# Patient Record
Sex: Female | Born: 1960 | Race: White | Hispanic: No | Marital: Single | Smoking: Never smoker
Health system: Southern US, Community
[De-identification: ages and names within clinical notes are randomized; demographics above are authoritative.]

## PROBLEM LIST (undated history)

## (undated) DIAGNOSIS — IMO0002 Reserved for concepts with insufficient information to code with codable children: Secondary | ICD-10-CM

## (undated) DIAGNOSIS — R159 Full incontinence of feces: Secondary | ICD-10-CM

## (undated) DIAGNOSIS — G8220 Paraplegia, unspecified: Secondary | ICD-10-CM

## (undated) DIAGNOSIS — F419 Anxiety disorder, unspecified: Secondary | ICD-10-CM

## (undated) DIAGNOSIS — A048 Other specified bacterial intestinal infections: Secondary | ICD-10-CM

## (undated) HISTORY — DX: Other specified bacterial intestinal infections: A04.8

## (undated) HISTORY — PX: BACK SURGERY: SHX140

## (undated) HISTORY — PX: UPPER GASTROINTESTINAL ENDOSCOPY: SHX188

## (undated) HISTORY — DX: Full incontinence of feces: R15.9

## (undated) HISTORY — DX: Anxiety disorder, unspecified: F41.9

## (undated) HISTORY — DX: Paraplegia, unspecified: G82.20

## (undated) HISTORY — DX: Reserved for concepts with insufficient information to code with codable children: IMO0002

---

## 2006-08-30 ENCOUNTER — Ambulatory Visit: Payer: Self-pay | Admitting: Internal Medicine

## 2006-09-08 ENCOUNTER — Ambulatory Visit: Payer: Self-pay | Admitting: Internal Medicine

## 2007-07-02 DIAGNOSIS — G8220 Paraplegia, unspecified: Secondary | ICD-10-CM | POA: Insufficient documentation

## 2007-07-02 DIAGNOSIS — F39 Unspecified mood [affective] disorder: Secondary | ICD-10-CM

## 2007-07-02 DIAGNOSIS — K5289 Other specified noninfective gastroenteritis and colitis: Secondary | ICD-10-CM

## 2007-07-02 DIAGNOSIS — Z87828 Personal history of other (healed) physical injury and trauma: Secondary | ICD-10-CM

## 2007-07-02 DIAGNOSIS — K625 Hemorrhage of anus and rectum: Secondary | ICD-10-CM

## 2007-07-02 DIAGNOSIS — R159 Full incontinence of feces: Secondary | ICD-10-CM | POA: Insufficient documentation

## 2007-07-14 ENCOUNTER — Encounter
Admission: RE | Admit: 2007-07-14 | Discharge: 2007-07-14 | Payer: Self-pay | Admitting: Physical Medicine & Rehabilitation

## 2009-11-02 ENCOUNTER — Emergency Department (HOSPITAL_COMMUNITY): Admission: EM | Admit: 2009-11-02 | Discharge: 2009-11-02 | Payer: Self-pay | Admitting: Emergency Medicine

## 2010-05-23 LAB — COMPREHENSIVE METABOLIC PANEL
AST: 24 U/L (ref 0–37)
Alkaline Phosphatase: 55 U/L (ref 39–117)
Calcium: 10 mg/dL (ref 8.4–10.5)
Creatinine, Ser: 0.85 mg/dL (ref 0.4–1.2)
GFR calc non Af Amer: >60 mL/min (ref >60)
Glucose, Bld: 117 mg/dL — ABNORMAL HIGH (ref 70–99)
Sodium: 139 mEq/L (ref 135–145)

## 2010-05-23 LAB — CBC
MCV: 93.6 fL (ref 78.0–100.0)
Platelets: 300 10*3/uL (ref 150–400)
RDW: 12.1 % (ref 11.5–15.5)

## 2010-05-23 LAB — DIFFERENTIAL
Basophils Relative: 0 % (ref 0–1)
Lymphs Abs: 2.3 10*3/uL (ref 0.7–4.0)
Monocytes Absolute: 0.6 10*3/uL (ref 0.1–1.0)
Neutrophils Relative %: 79 % — ABNORMAL HIGH (ref 43–77)

## 2010-05-23 LAB — LIPASE, BLOOD: Lipase: 20 U/L (ref 11–59)

## 2010-05-23 LAB — URINALYSIS, ROUTINE W REFLEX MICROSCOPIC
Bilirubin Urine: NEGATIVE
Ketones, ur: NEGATIVE mg/dL
Protein, ur: NEGATIVE mg/dL
Specific Gravity, Urine: 1.024 (ref 1.005–1.030)

## 2010-05-23 LAB — URINE CULTURE

## 2010-07-11 ENCOUNTER — Ambulatory Visit: Payer: Self-pay | Admitting: Physical Medicine and Rehabilitation

## 2010-07-15 ENCOUNTER — Encounter: Payer: 59 | Attending: Physical Medicine and Rehabilitation | Admitting: Physical Medicine and Rehabilitation

## 2010-07-15 DIAGNOSIS — G8220 Paraplegia, unspecified: Secondary | ICD-10-CM

## 2010-07-15 DIAGNOSIS — N319 Neuromuscular dysfunction of bladder, unspecified: Secondary | ICD-10-CM

## 2010-07-15 DIAGNOSIS — M719 Bursopathy, unspecified: Secondary | ICD-10-CM

## 2010-07-15 DIAGNOSIS — K592 Neurogenic bowel, not elsewhere classified: Secondary | ICD-10-CM | POA: Insufficient documentation

## 2010-07-15 DIAGNOSIS — M67919 Unspecified disorder of synovium and tendon, unspecified shoulder: Secondary | ICD-10-CM

## 2010-07-15 DIAGNOSIS — S069X9S Unspecified intracranial injury with loss of consciousness of unspecified duration, sequela: Secondary | ICD-10-CM | POA: Insufficient documentation

## 2010-07-15 DIAGNOSIS — S069XAS Unspecified intracranial injury with loss of consciousness status unknown, sequela: Secondary | ICD-10-CM | POA: Insufficient documentation
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
## 2010-08-20 ENCOUNTER — Encounter: Payer: 59 | Admitting: Physical Medicine and Rehabilitation

## 2011-10-22 DIAGNOSIS — M810 Age-related osteoporosis without current pathological fracture: Secondary | ICD-10-CM | POA: Insufficient documentation

## 2011-10-29 DIAGNOSIS — E559 Vitamin D deficiency, unspecified: Secondary | ICD-10-CM | POA: Insufficient documentation

## 2012-10-03 ENCOUNTER — Encounter: Payer: Self-pay | Admitting: *Deleted

## 2012-10-03 ENCOUNTER — Telehealth: Payer: Self-pay | Admitting: Internal Medicine

## 2012-10-03 NOTE — Telephone Encounter (Signed)
Left message for patient to call back  

## 2012-10-03 NOTE — Telephone Encounter (Signed)
Patient will come in and see Dr. Juanda Chance tomorrow at 9:45

## 2012-10-04 ENCOUNTER — Ambulatory Visit (INDEPENDENT_AMBULATORY_CARE_PROVIDER_SITE_OTHER): Payer: 59 | Admitting: Internal Medicine

## 2012-10-04 ENCOUNTER — Encounter: Payer: Self-pay | Admitting: Internal Medicine

## 2012-10-04 ENCOUNTER — Other Ambulatory Visit (INDEPENDENT_AMBULATORY_CARE_PROVIDER_SITE_OTHER): Payer: 59

## 2012-10-04 VITALS — BP 110/78 | HR 68

## 2012-10-04 DIAGNOSIS — K219 Gastro-esophageal reflux disease without esophagitis: Secondary | ICD-10-CM

## 2012-10-04 DIAGNOSIS — K920 Hematemesis: Secondary | ICD-10-CM

## 2012-10-04 LAB — CBC WITH DIFFERENTIAL/PLATELET
Eosinophils Absolute: 0.1 10*3/uL (ref 0.0–0.7)
Hemoglobin: 14.4 g/dL (ref 12.0–15.0)
Lymphs Abs: 2.3 10*3/uL (ref 0.7–4.0)
Monocytes Absolute: 0.4 10*3/uL (ref 0.1–1.0)
Monocytes Relative: 5.2 % (ref 3.0–12.0)
Neutro Abs: 4.3 10*3/uL (ref 1.4–7.7)
Platelets: 280 10*3/uL (ref 150.0–400.0)
RDW: 12.5 % (ref 11.5–14.6)

## 2012-10-04 LAB — COMPREHENSIVE METABOLIC PANEL
ALT: 15 U/L (ref 0–35)
AST: 16 U/L (ref 0–37)
Albumin: 4.7 g/dL (ref 3.5–5.2)
Alkaline Phosphatase: 76 U/L (ref 39–117)
CO2: 29 mEq/L (ref 19–32)
Calcium: 10.3 mg/dL (ref 8.4–10.5)
Chloride: 101 mEq/L (ref 96–112)
GFR: 109.55 mL/min (ref >60.00)
Glucose, Bld: 96 mg/dL (ref 70–99)
Potassium: 3.7 mEq/L (ref 3.5–5.1)
Sodium: 138 mEq/L (ref 135–145)
Total Bilirubin: 1 mg/dL (ref 0.3–1.2)
Total Protein: 8.2 g/dL (ref 6.0–8.3)

## 2012-10-04 LAB — IBC PANEL: Iron: 111 ug/dL (ref 42–145)

## 2012-10-04 MED ORDER — PROMETHAZINE HCL 12.5 MG PO TABS: ORAL_TABLET | ORAL | Status: DC

## 2012-10-04 MED ORDER — OMEPRAZOLE 20 MG PO CPDR: 20.0000 mg | DELAYED_RELEASE_CAPSULE | Freq: Every day | ORAL | Status: DC

## 2012-10-04 NOTE — Patient Instructions (Addendum)
You have been scheduled for an endoscopy with propofol. Please follow written instructions given to you at your visit today. If you use inhalers (even only as needed), please bring them with you on the day of your procedure. Your physician has requested that you go to www.startemmi.com and enter the access code given to you at your visit today. This web site gives a general overview about your procedure. However, you should still follow specific instructions given to you by our office regarding your preparation for the procedure.  Your physician has requested that you go to the basement for the following lab work before leaving today: CBC, IBC, B12, CMET, TSH  We have sent the following medications to your mail in pharmacy: Phenergan Prilosec  CC: Dr Felipa Eth
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
## 2012-10-05 NOTE — Interval H&P Note (Signed)
History and Physical Interval Note:  10/05/2012 9:15 PM  Catherine Oneal  has presented today for surgery, with the diagnosis of hematemesis 578.0  The various methods of treatment have been discussed with the patient and family. After consideration of risks, benefits and other options for treatment, the patient has consented to  Procedure(s) with comments: ESOPHAGOGASTRODUODENOSCOPY (EGD) (N/A) - patient is paraplegic as a surgical intervention .  The patient's history has been reviewed, patient examined, no change in status, stable for surgery.  I have reviewed the patient's chart and labs.  Questions were answered to the patient's satisfaction.     [name]

## 2012-10-06 ENCOUNTER — Encounter (HOSPITAL_COMMUNITY)
Admission: RE | Disposition: A | Discharge status: Home / Self Care | Payer: Self-pay | Source: Ambulatory Visit | Attending: Internal Medicine

## 2012-10-06 ENCOUNTER — Telehealth: Payer: Self-pay | Admitting: Internal Medicine

## 2012-10-06 ENCOUNTER — Ambulatory Visit (HOSPITAL_COMMUNITY)
Admission: RE | Admit: 2012-10-06 | Discharge: 2012-10-06 | Disposition: A | Discharge status: Home / Self Care | Payer: 59 | Source: Ambulatory Visit | Attending: Internal Medicine | Admitting: Internal Medicine

## 2012-10-06 ENCOUNTER — Encounter (HOSPITAL_COMMUNITY): Payer: Self-pay | Admitting: *Deleted

## 2012-10-06 DIAGNOSIS — F3289 Other specified depressive episodes: Secondary | ICD-10-CM | POA: Insufficient documentation

## 2012-10-06 DIAGNOSIS — K3189 Other diseases of stomach and duodenum: Secondary | ICD-10-CM | POA: Insufficient documentation

## 2012-10-06 DIAGNOSIS — D131 Benign neoplasm of stomach: Secondary | ICD-10-CM | POA: Insufficient documentation

## 2012-10-06 DIAGNOSIS — A048 Other specified bacterial intestinal infections: Secondary | ICD-10-CM | POA: Insufficient documentation

## 2012-10-06 DIAGNOSIS — R159 Full incontinence of feces: Secondary | ICD-10-CM | POA: Insufficient documentation

## 2012-10-06 DIAGNOSIS — K589 Irritable bowel syndrome without diarrhea: Secondary | ICD-10-CM | POA: Insufficient documentation

## 2012-10-06 DIAGNOSIS — F329 Major depressive disorder, single episode, unspecified: Secondary | ICD-10-CM | POA: Insufficient documentation

## 2012-10-06 DIAGNOSIS — G8220 Paraplegia, unspecified: Secondary | ICD-10-CM | POA: Insufficient documentation

## 2012-10-06 DIAGNOSIS — K2960 Other gastritis without bleeding: Secondary | ICD-10-CM

## 2012-10-06 DIAGNOSIS — K920 Hematemesis: Secondary | ICD-10-CM

## 2012-10-06 HISTORY — PX: ESOPHAGOGASTRODUODENOSCOPY: SHX5428

## 2012-10-06 SURGERY — EGD (ESOPHAGOGASTRODUODENOSCOPY)
Anesthesia: Moderate Sedation

## 2012-10-06 MED ORDER — FENTANYL CITRATE 0.05 MG/ML IJ SOLN
INTRAMUSCULAR | Status: DC | PRN
  Administered 2012-10-06 (×3): 25 ug via INTRAVENOUS

## 2012-10-06 MED ORDER — MIDAZOLAM HCL 10 MG/2ML IJ SOLN
INTRAMUSCULAR | Status: DC | PRN
  Administered 2012-10-06 (×5): 2 mg via INTRAVENOUS

## 2012-10-06 MED ORDER — BUTAMBEN-TETRACAINE-BENZOCAINE 2-2-14 % EX AERO
INHALATION_SPRAY | CUTANEOUS | Status: DC | PRN
  Administered 2012-10-06: 1 via TOPICAL

## 2012-10-06 MED ORDER — SODIUM CHLORIDE 0.9 % IV SOLN
INTRAVENOUS | Status: DC
  Administered 2012-10-06: 10:00:00 via INTRAVENOUS

## 2012-10-06 MED ORDER — MIDAZOLAM HCL 10 MG/2ML IJ SOLN
INTRAMUSCULAR | Status: AC
  Filled 2012-10-06: qty 4

## 2012-10-06 MED ORDER — FENTANYL CITRATE 0.05 MG/ML IJ SOLN
INTRAMUSCULAR | Status: AC
  Filled 2012-10-06: qty 4

## 2012-10-06 NOTE — Op Note (Signed)
[facility] [address] [place], [address]   ENDOSCOPY PROCEDURE REPORT  PATIENT: Catherine, Oneal.  MR#: [id number] BIRTHDATE: Jul 13, 1960 , 51  yrs. old GENDER: Female ENDOSCOPIST: Hart Carwin, MD REFERRED BY:  Chilton Greathouse, M.D. PROCEDURE DATE:  10/06/2012 PROCEDURE:  EGD w/ biopsy ASA CLASS:     Class II INDICATIONS:  Dyspepsia. MEDICATIONS: These medications were titrated to patient response per physician's verbal order, Fentanyl 100 mcg IV, and Versed 7 mg IV  TOPICAL ANESTHETIC: Cetacaine Spray  DESCRIPTION OF PROCEDURE: After the risks benefits and alternatives of the procedure were thoroughly explained, informed consent was obtained.  The Pentax Gastroscope [id number] endoscope was introduced through the mouth and advanced to the second portion of the duodenum. Without limitations.  The instrument was slowly withdrawn as the mucosa was fully examined.        ESOPHAGUS: The mucosa of the esophagus appeared normal.  STOMACH: Mild gastritis (inflammation) was found in the gastric antrum.Several streaks of erythema, no blood in the stomach, several fundic gland polyps in proximal stomach,  A biopsy was performed using cold forceps.  Sample obtained for helicobacter pylori testing.Duodenal bulb and descending duodenum were normal Retroflexed views revealed no abnormalities.  There was moderate amount of bile along the greater curvature of the stomach.   The scope was then withdrawn from the patient and the procedure completed.  COMPLICATIONS: There were no complications. ENDOSCOPIC IMPRESSION:  1.   The mucosa of the esophagus appeared normal 2.   Gastritis (inflammation) was found in the gastric antrum; biopsy 3 . normal duodenal bulb and descending duodenum Nothing to account for patient's symptoms,  RECOMMENDATIONS: 1.  Await biopsy results 2.  Continue PPI 3.  may need to reevaluate gall bladder function/HIDA, her LFT's  are normal  REPEAT EXAM: no  eSigned:  Hart Carwin, MD 10/06/2012 10:42 AM   CC:  PATIENT NAME:  Catherine Oneal. MR#: [id number]

## 2012-10-06 NOTE — Interval H&P Note (Signed)
History and Physical Interval Note:  10/06/2012 10:14 AM  Catherine Oneal  has presented today for surgery, with the diagnosis of hematemesis 578.0  The various methods of treatment have been discussed with the patient and family. After consideration of risks, benefits and other options for treatment, the patient has consented to  Procedure(s) with comments: ESOPHAGOGASTRODUODENOSCOPY (EGD) (N/A) - patient is paraplegic as a surgical intervention .  The patient's history has been reviewed, patient examined, no change in status, stable for surgery.  I have reviewed the patient's chart and labs.  Questions were answered to the patient's satisfaction.     [name]

## 2012-10-06 NOTE — Telephone Encounter (Signed)
Let pt know we need to wait for the bx results to come back and then Dr. Juanda Chance will go from there. Pt aware.

## 2012-10-07 ENCOUNTER — Encounter (HOSPITAL_COMMUNITY): Payer: Self-pay | Admitting: Internal Medicine

## 2012-10-07 ENCOUNTER — Other Ambulatory Visit: Payer: Self-pay | Admitting: Internal Medicine

## 2012-10-07 ENCOUNTER — Telehealth: Payer: Self-pay | Admitting: Internal Medicine

## 2012-10-07 DIAGNOSIS — K8020 Calculus of gallbladder without cholecystitis without obstruction: Secondary | ICD-10-CM

## 2012-10-07 MED ORDER — PROMETHAZINE HCL 12.5 MG PO TABS: ORAL_TABLET | ORAL | Status: DC

## 2012-10-07 NOTE — Progress Notes (Signed)
Patient has been scheduled for HIDA with [facility] on 10/17/12 @ 1 pm at [facility]. I have left a message for patient to call back. I have spoken to patient and have advised her of this. She verbalizes understanding and thinks she may need to change the date due to travel plans. I have given her the phone number to radiology to reschedule if needed. She also states that she feels the need to have an "urgent physical" and does not think she can wait for an extended time for a physical. I have advised her that unfortunately, most any pcp will have somewhat of a wait for her to get in to be seen. She states she will go to urgent care.

## 2012-10-07 NOTE — Telephone Encounter (Signed)
rx sent

## 2012-10-08 ENCOUNTER — Encounter: Payer: Self-pay | Admitting: Internal Medicine

## 2012-10-10 ENCOUNTER — Telehealth: Payer: Self-pay | Admitting: Internal Medicine

## 2012-10-10 MED ORDER — AMOXICILLIN 500 MG PO TABS: 500.0000 mg | ORAL_TABLET | Freq: Three times a day (TID) | ORAL | Status: DC

## 2012-10-10 MED ORDER — OMEPRAZOLE 20 MG PO CPDR: 20.0000 mg | DELAYED_RELEASE_CAPSULE | Freq: Two times a day (BID) | ORAL | Status: DC

## 2012-10-10 MED ORDER — CLARITHROMYCIN 500 MG PO TABS: 500.0000 mg | ORAL_TABLET | Freq: Two times a day (BID) | ORAL | Status: DC

## 2012-10-10 NOTE — Telephone Encounter (Signed)
Informed pt of H. Pylori infection and I ordered her meds at her pharmacy and mailed her the path report; pt stated understanding.

## 2012-10-11 ENCOUNTER — Encounter: Payer: Self-pay | Admitting: Internal Medicine

## 2012-10-11 NOTE — Progress Notes (Signed)
I have spoken to [facility] @ Radiology scheduling. She has cancelled patient's HIDA scan as per Dr Regino Schultze request. Dr Juanda Chance states that patient was recently found to have HPylori. Therefore, she needs to be treated for this before proceeding with HIDA scan as the H Pylori may be the cause of her discomfort. Patient has been advised of this and verbalizes understanding. She will call us at the end of her H Pylori treatment course to let us know how she is doing.

## 2012-10-12 ENCOUNTER — Telehealth: Payer: Self-pay | Admitting: Internal Medicine

## 2012-10-12 NOTE — Telephone Encounter (Signed)
If she take Prilosec for few weeks or months, it will not have a significant impact on her bones. By the way , I don't see where she ever had a bone density. She should have it done every 5 years since she is a risk for osteoporosis. Did Dr Duaine Dredge do one? I don't know what kind of spine specialist she needs, there is a Spine center on [address], she may call to enquire about their services.

## 2012-10-12 NOTE — Telephone Encounter (Signed)
Patient is concerned about taking Prilosec and osteoporosis side effect. Especially because she is a paraplegic. She is asking if Dr. Juanda Chance knows of any spinal injury specialist that she can recommend. Patient wants to get connected to one in this area. Please, advise.

## 2012-10-13 NOTE — Telephone Encounter (Signed)
Left a message for patient to call me. 

## 2012-10-13 NOTE — Telephone Encounter (Signed)
Patient given Dr. Regino Schultze recommendations. She states Dr. Felipa Eth has done several bone density studies on her.

## 2012-10-14 ENCOUNTER — Telehealth: Payer: Self-pay | Admitting: Internal Medicine

## 2012-10-14 NOTE — Telephone Encounter (Signed)
Spoke with patient and clarified that Biaxin is a trade name of the medication she is taking.

## 2012-10-17 ENCOUNTER — Encounter (HOSPITAL_COMMUNITY): Admission: RE | Admit: 2012-10-17 | Payer: 59 | Source: Ambulatory Visit

## 2012-11-08 ENCOUNTER — Encounter: Payer: Self-pay | Admitting: *Deleted

## 2012-11-14 DIAGNOSIS — N319 Neuromuscular dysfunction of bladder, unspecified: Secondary | ICD-10-CM | POA: Insufficient documentation

## 2012-11-22 ENCOUNTER — Encounter: Payer: Self-pay | Admitting: Internal Medicine

## 2012-11-22 ENCOUNTER — Ambulatory Visit (INDEPENDENT_AMBULATORY_CARE_PROVIDER_SITE_OTHER): Payer: 59 | Admitting: Internal Medicine

## 2012-11-22 VITALS — BP 92/60 | HR 76 | Ht 69.0 in | Wt 130.0 lb

## 2012-11-22 DIAGNOSIS — A048 Other specified bacterial intestinal infections: Secondary | ICD-10-CM

## 2012-11-22 DIAGNOSIS — G8220 Paraplegia, unspecified: Secondary | ICD-10-CM

## 2012-11-22 DIAGNOSIS — B9681 Helicobacter pylori [H. pylori] as the cause of diseases classified elsewhere: Secondary | ICD-10-CM

## 2012-11-22 DIAGNOSIS — K625 Hemorrhage of anus and rectum: Secondary | ICD-10-CM

## 2012-11-22 MED ORDER — MOVIPREP 100 G PO SOLR: 1.0000 | Freq: Once | ORAL | Status: DC

## 2012-11-22 NOTE — Addendum Note (Signed)
Addended by: Richardson Chiquito on: 11/22/2012 04:07 PM   Modules accepted: Orders

## 2012-11-22 NOTE — Addendum Note (Signed)
Addended by: Richardson Chiquito on: 11/22/2012 10:37 AM   Modules accepted: Orders
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
## 2012-11-22 NOTE — Patient Instructions (Addendum)
You have been scheduled for a colonoscopy with propofol. Please follow written instructions given to you at your visit today.  Please pick up your prep kit at the pharmacy within the next 1-3 days. If you use inhalers (even only as needed), please bring them with you on the day of your procedure. Your physician has requested that you go to www.startemmi.com and enter the access code given to you at your visit today. This web site gives a general overview about your procedure. However, you should still follow specific instructions given to you by our office regarding your preparation for the procedure.  You have been scheduled for an H. Pylori/Urea Breath Test. Your appointment is on 12/09/12 at 7:15 am. Please go to [facility] Admitting for check in. Please plan on being there for around 20-30 minutes.  Please follow the instructions below to prepare for your test:  1. 12/08/12 (one day prior to your test) avoid high gas foods, high fiber goods, fruits and fruit juices, vegetables, beans, cereals, fiber supplements, onion and bell peppers. You may eat hamburger, chicken, beef, tuna fish and eggs.  2. You should have nothing to eat or drink after 9:00 pm on 12/08/12.   3. Do not smoke, chew gum or eat hard candy the morning of your test.  4. You may not have the test within 2 weeks after taking any antibiotics or within 4 weeks if confirming eradication of H.Pylori.  5. On 11/25/12 (2 weeks prior to your test), stop any Pepto-Bismol and/or proton pump inhibitors (Prevacid, Zegerid, Nexium, Prilosec, Protonix, Aciphex, Dexilant).  6. You may use over the counter strength Axid, Pepcid, Tagamet and Zantac 2 weeks prior to your tests. However, you should STOP these 24 hours before the test. You may use Tums and Maalox. ______________________________________________________________________________________________________________  We have sent the following medications to your pharmacy for you to pick up  at your convenience: Phenergan  Please discontinue omeprazole.  Your physician has requested that you go to the basement for the following lab work before leaving today: B12  CC: Dr Felipa Eth

## 2012-12-09 ENCOUNTER — Encounter (HOSPITAL_COMMUNITY)
Admission: RE | Disposition: A | Discharge status: Home / Self Care | Payer: Self-pay | Source: Ambulatory Visit | Attending: Internal Medicine

## 2012-12-09 ENCOUNTER — Ambulatory Visit (HOSPITAL_COMMUNITY)
Admission: RE | Admit: 2012-12-09 | Discharge: 2012-12-09 | Disposition: A | Discharge status: Home / Self Care | Payer: 59 | Source: Ambulatory Visit | Attending: Internal Medicine | Admitting: Internal Medicine

## 2012-12-09 DIAGNOSIS — K2960 Other gastritis without bleeding: Secondary | ICD-10-CM | POA: Insufficient documentation

## 2012-12-09 DIAGNOSIS — A048 Other specified bacterial intestinal infections: Secondary | ICD-10-CM | POA: Insufficient documentation

## 2012-12-09 HISTORY — PX: BREATH TEK H PYLORI: SHX5422

## 2012-12-09 SURGERY — BREATH TEST, FOR HELICOBACTER PYLORI
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
## 2012-12-09 NOTE — Interval H&P Note (Signed)
History and Physical Interval Note:  12/09/2012 8:00 AM  Catherine Oneal  has presented today for surgery, with the diagnosis of h pylori  The various methods of treatment have been discussed with the patient and family. After consideration of risks, benefits and other options for treatment, the patient has consented to  Procedure(s): BREATH TEK H PYLORI (N/A) as a surgical intervention .  The patient's history has been reviewed, patient examined, no change in status, stable for surgery.  I have reviewed the patient's chart and labs.  Questions were answered to the patient's satisfaction.     [name]

## 2012-12-12 ENCOUNTER — Encounter (HOSPITAL_COMMUNITY): Payer: Self-pay | Admitting: Internal Medicine

## 2012-12-20 ENCOUNTER — Other Ambulatory Visit: Payer: Self-pay | Admitting: *Deleted

## 2012-12-20 ENCOUNTER — Telehealth: Payer: Self-pay | Admitting: Internal Medicine

## 2012-12-20 DIAGNOSIS — K625 Hemorrhage of anus and rectum: Secondary | ICD-10-CM

## 2012-12-20 NOTE — Telephone Encounter (Signed)
Left a message for patient again. 

## 2012-12-20 NOTE — Telephone Encounter (Signed)
Left a message for patient to call me. 

## 2012-12-20 NOTE — Telephone Encounter (Signed)
Spoke with patient and she wants to rescheduled to December for colonoscopy. Spoke with Noreene Larsson and moved to 02/08/13 at 10:30 AM. Patient aware/

## 2012-12-23 ENCOUNTER — Encounter: Payer: Self-pay | Admitting: Internal Medicine

## 2013-02-06 ENCOUNTER — Telehealth: Payer: Self-pay | Admitting: Internal Medicine

## 2013-02-07 NOTE — Telephone Encounter (Signed)
Left message for patient to call me again. 

## 2013-02-07 NOTE — Telephone Encounter (Signed)
Left a message for patient to call me. 

## 2013-02-07 NOTE — Telephone Encounter (Signed)
Please charge late cancellation. 

## 2013-02-07 NOTE — Telephone Encounter (Signed)
Spoke with patient and she is having problems with her insurance not paying for procedure. She is working with her employer and is trying to work it out. Needs to cancel procedure at [facility] tomorrow. She will call back to reschedule once she has these issues resolved. Cancelled procedure with Noreene Larsson.

## 2013-02-08 ENCOUNTER — Encounter (HOSPITAL_COMMUNITY): Admission: RE | Payer: Self-pay | Source: Ambulatory Visit

## 2013-02-08 ENCOUNTER — Ambulatory Visit (HOSPITAL_COMMUNITY): Admission: RE | Admit: 2013-02-08 | Payer: 59 | Source: Ambulatory Visit | Admitting: Internal Medicine

## 2013-02-08 SURGERY — COLONOSCOPY
Anesthesia: Moderate Sedation

## 2013-05-09 DIAGNOSIS — R32 Unspecified urinary incontinence: Secondary | ICD-10-CM | POA: Insufficient documentation

## 2013-05-21 DIAGNOSIS — F419 Anxiety disorder, unspecified: Secondary | ICD-10-CM | POA: Insufficient documentation

## 2013-05-26 ENCOUNTER — Telehealth: Payer: Self-pay | Admitting: Internal Medicine

## 2013-05-26 DIAGNOSIS — R109 Unspecified abdominal pain: Secondary | ICD-10-CM

## 2013-05-26 NOTE — Telephone Encounter (Signed)
OK I will see her. We may get a stool test for H.Pylori before I see her.

## 2013-05-26 NOTE — Telephone Encounter (Signed)
Left a message for patient to call me. 

## 2013-05-26 NOTE — Telephone Encounter (Signed)
Spoke with patient and she states she is having reflux symptoms again. She is concerned that the H. Pylori may be back. She is also interested in getting her colonoscopy rescheduled that she cancelled last year. Offered OV with extender for reflux but she only wants to see Dr. Olevia Perches. Scheduled OV on 06/13/13 at 2:00 PM.

## 2013-05-29 NOTE — Telephone Encounter (Signed)
Patient notified of recommendations. Lab in EPIC.

## 2013-06-07 ENCOUNTER — Other Ambulatory Visit: Payer: 59

## 2013-06-13 ENCOUNTER — Encounter: Payer: Self-pay | Admitting: Internal Medicine

## 2013-06-13 ENCOUNTER — Other Ambulatory Visit: Payer: 59

## 2013-06-13 ENCOUNTER — Ambulatory Visit (INDEPENDENT_AMBULATORY_CARE_PROVIDER_SITE_OTHER): Payer: 59 | Admitting: Internal Medicine

## 2013-06-13 VITALS — BP 100/60 | HR 68 | Ht 69.0 in

## 2013-06-13 DIAGNOSIS — R935 Abnormal findings on diagnostic imaging of other abdominal regions, including retroperitoneum: Secondary | ICD-10-CM

## 2013-06-13 DIAGNOSIS — G8220 Paraplegia, unspecified: Secondary | ICD-10-CM

## 2013-06-13 DIAGNOSIS — K8020 Calculus of gallbladder without cholecystitis without obstruction: Secondary | ICD-10-CM

## 2013-06-13 DIAGNOSIS — N319 Neuromuscular dysfunction of bladder, unspecified: Secondary | ICD-10-CM

## 2013-06-13 DIAGNOSIS — K2960 Other gastritis without bleeding: Secondary | ICD-10-CM

## 2013-06-13 DIAGNOSIS — R109 Unspecified abdominal pain: Secondary | ICD-10-CM

## 2013-06-13 MED ORDER — OMEPRAZOLE 20 MG PO CPDR: 20.0000 mg | DELAYED_RELEASE_CAPSULE | Freq: Every day | ORAL | Status: DC | PRN

## 2013-06-13 MED ORDER — PROMETHAZINE HCL 12.5 MG PO TABS: ORAL_TABLET | ORAL | Status: DC

## 2013-06-13 NOTE — Patient Instructions (Addendum)
You have been scheduled for an appointment with Dr Louis Meckel @ [facility] on 06/19/13 @ 10:30 am. Please arrive at 10:15 am for registration. Make certain to bring a list of current medications, including any over the counter medications or vitamins. Also bring your co-pay if you have one as well as your insurance cards. [facility] is located at [address], [place], [place] [address]. Should you need to reschedule your appointment, please contact them at [phone].   We have sent the following medications to your pharmacy for you to pick up at your convenience: Prilosec 20 mg daily as needed Phenergan 12.5 mg-Take 1 tablet every 6-8 hours as needed  You have been scheduled for an abdominal ultrasound at [facility] (1st floor of hospital) on Thursday, 06/15/13 at 8:30. Please arrive 15 minutes prior to your appointment for registration. Make certain not to have anything to eat or drink 6 hours prior to your appointment. Should you need to reschedule your appointment, please contact radiology at [phone]. This test typically takes about 30 minutes to perform.  CC: Dr Florina Ou, Dr Louis Meckel
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
## 2013-06-15 ENCOUNTER — Ambulatory Visit (HOSPITAL_COMMUNITY): Payer: 59

## 2013-06-15 LAB — H. PYLORI ANTIGEN, STOOL: H pylori Ag, Stl: NEGATIVE

## 2013-06-23 ENCOUNTER — Ambulatory Visit (HOSPITAL_COMMUNITY)
Admission: RE | Admit: 2013-06-23 | Discharge: 2013-06-23 | Disposition: A | Discharge status: Home / Self Care | Payer: 59 | Source: Ambulatory Visit | Attending: Internal Medicine | Admitting: Internal Medicine

## 2013-06-23 DIAGNOSIS — R935 Abnormal findings on diagnostic imaging of other abdominal regions, including retroperitoneum: Secondary | ICD-10-CM

## 2013-06-26 ENCOUNTER — Ambulatory Visit (HOSPITAL_COMMUNITY): Payer: 59

## 2013-06-30 ENCOUNTER — Ambulatory Visit (HOSPITAL_COMMUNITY)
Admission: RE | Admit: 2013-06-30 | Discharge: 2013-06-30 | Disposition: A | Discharge status: Home / Self Care | Payer: 59 | Source: Ambulatory Visit | Attending: Internal Medicine | Admitting: Internal Medicine

## 2013-06-30 DIAGNOSIS — K838 Other specified diseases of biliary tract: Secondary | ICD-10-CM | POA: Insufficient documentation

## 2013-06-30 DIAGNOSIS — K8020 Calculus of gallbladder without cholecystitis without obstruction: Secondary | ICD-10-CM | POA: Insufficient documentation

## 2013-07-03 ENCOUNTER — Telehealth: Payer: Self-pay | Admitting: Internal Medicine

## 2013-07-03 NOTE — Telephone Encounter (Signed)
Left message for pt to call back  °

## 2013-07-07 NOTE — Telephone Encounter (Signed)
See results for additional documentation 

## 2013-07-10 ENCOUNTER — Other Ambulatory Visit: Payer: Self-pay | Admitting: *Deleted

## 2013-07-10 ENCOUNTER — Telehealth: Payer: Self-pay | Admitting: *Deleted

## 2013-07-10 DIAGNOSIS — K8020 Calculus of gallbladder without cholecystitis without obstruction: Secondary | ICD-10-CM

## 2013-07-10 NOTE — Telephone Encounter (Signed)
Patient to call back to give dates for HIDA. Needs to be scheduled after this.

## 2013-07-21 ENCOUNTER — Telehealth: Payer: Self-pay | Admitting: *Deleted

## 2013-07-21 NOTE — Telephone Encounter (Signed)
Unable to reach patient. Mailbox full. Will try again later.

## 2013-07-21 NOTE — Telephone Encounter (Signed)
Message copied by Hulan Saas on Fri Jul 21, 2013  9:23 AM ------      Message from: Hulan Saas      Created: Tue Jul 11, 2013 11:02 AM       Did patient call to schedule HIDA?  ------

## 2013-07-24 NOTE — Telephone Encounter (Signed)
On 07/07/13, patient was given results of abdominal ultrasound. She was told Dr. Olevia Perches requested she obtain labs and schedule HIDA scan. Patient has not gotten labs done and she has not scheduled a HIDA. Dr. Olevia Perches, wanted you to be aware of this.

## 2013-07-24 NOTE — Telephone Encounter (Signed)
Thank you. I will not see her in the office unless she has completed the labs and HIDA.

## 2013-08-15 ENCOUNTER — Telehealth: Payer: Self-pay | Admitting: Internal Medicine

## 2013-08-15 NOTE — Telephone Encounter (Signed)
Dr Olevia Perches, patient wants omeprazole sent to local pharmacy. However, it appears that she still has not completed the HIDA scan or labs that you requested she have. Do you want me to still refill the omeprazole?

## 2013-08-15 NOTE — Telephone Encounter (Signed)
OK to refill Omeprazole  As per pt's request.

## 2013-08-16 MED ORDER — OMEPRAZOLE 20 MG PO CPDR: 20.0000 mg | DELAYED_RELEASE_CAPSULE | Freq: Every day | ORAL | Status: DC | PRN

## 2013-08-16 NOTE — Telephone Encounter (Signed)
Rx sent to [facility] as per patient request. 

## 2013-10-09 ENCOUNTER — Other Ambulatory Visit: Payer: Self-pay | Admitting: Internal Medicine

## 2013-11-08 DIAGNOSIS — K592 Neurogenic bowel, not elsewhere classified: Secondary | ICD-10-CM | POA: Insufficient documentation

## 2014-02-07 ENCOUNTER — Encounter: Payer: Self-pay | Admitting: Internal Medicine

## 2014-02-19 ENCOUNTER — Other Ambulatory Visit: Payer: Self-pay | Admitting: Internal Medicine

## 2014-03-19 ENCOUNTER — Telehealth: Payer: Self-pay | Admitting: Internal Medicine

## 2014-03-19 NOTE — Telephone Encounter (Signed)
Spoke with patient and kept appointment. She had been scheduled in the past for colonoscopy and was unable to keep appointment. She states she does transfer from her chair.

## 2014-03-21 ENCOUNTER — Ambulatory Visit (AMBULATORY_SURGERY_CENTER): Payer: Medicare Other | Admitting: *Deleted

## 2014-03-21 VITALS — Ht 69.0 in | Wt 130.0 lb

## 2014-03-21 DIAGNOSIS — K921 Melena: Secondary | ICD-10-CM

## 2014-03-21 MED ORDER — MOVIPREP 100 G PO SOLR: ORAL | Status: DC

## 2014-03-21 NOTE — Progress Notes (Signed)
No egg or soy allergy  No anesthesia or intubation problems per pt  No diet medications taken  Registered in [place]  Pt is in a wheelchair.  She was in here in 2008.  She states that she is able to pivot to stretcher without difficulty- she was here with her friend a few weeks ago and knows that she can transfer with minimal assistance.  She is able to drive herself and pivot to her car without assistance.  Pt states that she had her procedure with no sedation last time and wishes to do the same this time  [place] made aware and ok for [place]

## 2014-03-28 ENCOUNTER — Telehealth: Payer: Self-pay | Admitting: Internal Medicine

## 2014-03-29 NOTE — Telephone Encounter (Signed)
Left a message for patient to call back. 

## 2014-03-29 NOTE — Telephone Encounter (Signed)
Spoke with patient and she states she got a different Omeprazole from her pharmacy. She wanted to be sure Dr Olevia Perches had not changed her rx. Told patient Omeprazole 20 mg was sent and that the pharmacy may have a different vendor now. She will call them.

## 2014-04-04 ENCOUNTER — Encounter: Payer: 59 | Admitting: Internal Medicine

## 2014-04-12 ENCOUNTER — Telehealth: Payer: Self-pay | Admitting: Internal Medicine

## 2014-04-12 MED ORDER — OMEPRAZOLE 20 MG PO CPDR: DELAYED_RELEASE_CAPSULE | ORAL | Status: DC

## 2014-04-12 NOTE — Telephone Encounter (Signed)
Rx for Omeprazole (Prilosec), 20 mg, #90, no refills, was sent to [facility] in [place], [place].

## 2014-04-20 ENCOUNTER — Telehealth: Payer: Self-pay | Admitting: Internal Medicine

## 2014-04-23 NOTE — Telephone Encounter (Signed)
Left message for patient to call me back on 04/23/14. Waiting for patient to respond.

## 2014-04-23 NOTE — Telephone Encounter (Signed)
Spoke to patient regarding their Rx for Omeprazole (PRILOSEC), 20 mg, #90, no refills. Patient stated that she is getting inconsistent results with Omeprazole delayed release. Is there another medication the patent can take instead? Please advise.

## 2014-04-23 NOTE — Telephone Encounter (Signed)
Try Protonix 40mg , #90, 1 po qd. If not covered by her insurance  Then try Prilosec 40 mg, #90, 1 po qd. She needs to find out what PPI her insurance prefers.

## 2014-04-24 ENCOUNTER — Other Ambulatory Visit: Payer: Self-pay | Admitting: *Deleted

## 2014-04-24 ENCOUNTER — Telehealth: Payer: Self-pay | Admitting: *Deleted

## 2014-04-24 MED ORDER — PANTOPRAZOLE SODIUM 40 MG PO TBEC: 40.0000 mg | DELAYED_RELEASE_TABLET | Freq: Every day | ORAL | Status: DC

## 2014-04-24 NOTE — Telephone Encounter (Signed)
Sent Rx for Protonix, 40 mg, #90, one refill to [facility]. Take 1 tablet, by mouth, every day.

## 2014-05-08 ENCOUNTER — Telehealth: Payer: Self-pay | Admitting: Internal Medicine

## 2014-05-08 NOTE — Telephone Encounter (Signed)
Please charge late cancellation, no reschedule without appointment.

## 2014-05-09 ENCOUNTER — Encounter: Payer: Medicare Other | Admitting: Internal Medicine

## 2014-05-22 NOTE — Telephone Encounter (Signed)
Unable to [facility] due to her [employer].

## 2015-03-12 DIAGNOSIS — F331 Major depressive disorder, recurrent, moderate: Secondary | ICD-10-CM | POA: Diagnosis not present

## 2015-04-08 DIAGNOSIS — F331 Major depressive disorder, recurrent, moderate: Secondary | ICD-10-CM | POA: Diagnosis not present

## 2015-04-22 DIAGNOSIS — F331 Major depressive disorder, recurrent, moderate: Secondary | ICD-10-CM | POA: Diagnosis not present

## 2015-04-23 DIAGNOSIS — F411 Generalized anxiety disorder: Secondary | ICD-10-CM | POA: Diagnosis not present

## 2015-05-06 DIAGNOSIS — F331 Major depressive disorder, recurrent, moderate: Secondary | ICD-10-CM | POA: Diagnosis not present

## 2015-05-21 DIAGNOSIS — F411 Generalized anxiety disorder: Secondary | ICD-10-CM | POA: Diagnosis not present

## 2015-05-22 DIAGNOSIS — F331 Major depressive disorder, recurrent, moderate: Secondary | ICD-10-CM | POA: Diagnosis not present

## 2015-06-05 DIAGNOSIS — Z1151 Encounter for screening for human papillomavirus (HPV): Secondary | ICD-10-CM | POA: Diagnosis not present

## 2015-06-05 DIAGNOSIS — Z01419 Encounter for gynecological examination (general) (routine) without abnormal findings: Secondary | ICD-10-CM | POA: Diagnosis not present

## 2015-06-05 DIAGNOSIS — Z124 Encounter for screening for malignant neoplasm of cervix: Secondary | ICD-10-CM | POA: Diagnosis not present

## 2015-06-05 DIAGNOSIS — Z0000 Encounter for general adult medical examination without abnormal findings: Secondary | ICD-10-CM | POA: Diagnosis not present

## 2015-06-21 DIAGNOSIS — F331 Major depressive disorder, recurrent, moderate: Secondary | ICD-10-CM | POA: Diagnosis not present

## 2015-07-01 DIAGNOSIS — F331 Major depressive disorder, recurrent, moderate: Secondary | ICD-10-CM | POA: Diagnosis not present

## 2015-07-02 DIAGNOSIS — F411 Generalized anxiety disorder: Secondary | ICD-10-CM | POA: Diagnosis not present

## 2015-07-08 DIAGNOSIS — F331 Major depressive disorder, recurrent, moderate: Secondary | ICD-10-CM | POA: Diagnosis not present

## 2015-07-16 DIAGNOSIS — F331 Major depressive disorder, recurrent, moderate: Secondary | ICD-10-CM | POA: Diagnosis not present

## 2015-07-29 DIAGNOSIS — F331 Major depressive disorder, recurrent, moderate: Secondary | ICD-10-CM | POA: Diagnosis not present

## 2015-08-01 DIAGNOSIS — F331 Major depressive disorder, recurrent, moderate: Secondary | ICD-10-CM | POA: Diagnosis not present

## 2015-08-06 DIAGNOSIS — F411 Generalized anxiety disorder: Secondary | ICD-10-CM | POA: Diagnosis not present

## 2015-08-21 DIAGNOSIS — F331 Major depressive disorder, recurrent, moderate: Secondary | ICD-10-CM | POA: Diagnosis not present

## 2015-09-03 DIAGNOSIS — F331 Major depressive disorder, recurrent, moderate: Secondary | ICD-10-CM | POA: Diagnosis not present

## 2015-09-11 DIAGNOSIS — Z803 Family history of malignant neoplasm of breast: Secondary | ICD-10-CM | POA: Diagnosis not present

## 2015-09-11 DIAGNOSIS — Z1231 Encounter for screening mammogram for malignant neoplasm of breast: Secondary | ICD-10-CM | POA: Diagnosis not present

## 2015-10-14 DIAGNOSIS — F411 Generalized anxiety disorder: Secondary | ICD-10-CM | POA: Diagnosis not present

## 2015-10-15 DIAGNOSIS — F331 Major depressive disorder, recurrent, moderate: Secondary | ICD-10-CM | POA: Diagnosis not present

## 2015-10-23 DIAGNOSIS — M810 Age-related osteoporosis without current pathological fracture: Secondary | ICD-10-CM | POA: Diagnosis not present

## 2015-10-23 DIAGNOSIS — G8220 Paraplegia, unspecified: Secondary | ICD-10-CM | POA: Diagnosis not present

## 2015-10-23 DIAGNOSIS — N319 Neuromuscular dysfunction of bladder, unspecified: Secondary | ICD-10-CM | POA: Diagnosis not present

## 2015-10-23 DIAGNOSIS — K592 Neurogenic bowel, not elsewhere classified: Secondary | ICD-10-CM | POA: Diagnosis not present

## 2015-10-24 DIAGNOSIS — F331 Major depressive disorder, recurrent, moderate: Secondary | ICD-10-CM | POA: Diagnosis not present

## 2015-11-14 DIAGNOSIS — F331 Major depressive disorder, recurrent, moderate: Secondary | ICD-10-CM | POA: Diagnosis not present

## 2015-11-18 ENCOUNTER — Ambulatory Visit (HOSPITAL_BASED_OUTPATIENT_CLINIC_OR_DEPARTMENT_OTHER): Payer: Medicare Other | Admitting: Physical Medicine & Rehabilitation

## 2015-11-18 ENCOUNTER — Encounter: Payer: Self-pay | Admitting: Physical Medicine & Rehabilitation

## 2015-11-18 ENCOUNTER — Encounter: Payer: 59 | Attending: Physical Medicine & Rehabilitation

## 2015-11-18 VITALS — BP 106/71 | HR 63 | Resp 14

## 2015-11-18 DIAGNOSIS — S22068S Other fracture of T7-T8 thoracic vertebra, sequela: Secondary | ICD-10-CM | POA: Diagnosis not present

## 2015-11-18 DIAGNOSIS — K592 Neurogenic bowel, not elsewhere classified: Secondary | ICD-10-CM | POA: Insufficient documentation

## 2015-11-18 DIAGNOSIS — M5430 Sciatica, unspecified side: Secondary | ICD-10-CM | POA: Diagnosis not present

## 2015-11-18 DIAGNOSIS — S24154S Other incomplete lesion at T11-T12 level of thoracic spinal cord, sequela: Secondary | ICD-10-CM | POA: Insufficient documentation

## 2015-11-18 DIAGNOSIS — G8220 Paraplegia, unspecified: Secondary | ICD-10-CM | POA: Insufficient documentation

## 2015-11-18 DIAGNOSIS — N319 Neuromuscular dysfunction of bladder, unspecified: Secondary | ICD-10-CM | POA: Diagnosis not present

## 2015-11-18 DIAGNOSIS — R6882 Decreased libido: Secondary | ICD-10-CM | POA: Insufficient documentation

## 2015-11-18 DIAGNOSIS — S24153S Other incomplete lesion at T7-T10 level of thoracic spinal cord, sequela: Secondary | ICD-10-CM

## 2015-11-18 MED ORDER — OXYBUTYNIN CHLORIDE 5 MG PO TABS: 5.0000 mg | ORAL_TABLET | Freq: Three times a day (TID) | ORAL | 1 refills | Status: DC

## 2015-11-18 NOTE — Progress Notes (Signed)
Subjective:    Patient ID: Catherine Oneal, female    DOB: 1960-04-26, 55 y.o.   MRN: [id number]  HPI  SCI from [place], Reports her injury level as T10-T12. Reviewed notes from [facility], Dr. Truman Hayward. 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 Patient has had DEXA scan showing osteopenia in the vertebral spine, osteoporosis in the femoral head. She is wondering what is the most effective means of osteoporosis prevention Intermittent cath 3-4 x per day Bowel with dig stim, occ stool softener or supp  Independent with dressing and bathing  Patient has questions regarding osteoporosis and prevention, standing frame with or without medications.  Patient also in need of new wheelchair. Current chair is 36 years old or greater. Multiple areas are becoming worn and deteriorated. We discussed manual versus power chair. She is currently not experiencing any upper extremity pain or limitations. She could transfer herself easily in and out of her motor vehicle as well as to other surfaces. She does wheel herself forexercise a couple miles a day  Pain Inventory Average Pain 0 Pain Right Now 0 My pain is no pain  In the last 24 hours, has pain interfered with the following? General activity 0 Relation with others 0 Enjoyment of life 0 What TIME of day is your pain at its worst? no pain Sleep (in general) Good  Pain is worse with: no pain Pain improves with: no pain Relief from Meds: no pain  Mobility use a wheelchair transfers alone  Function disabled: date disabled .  Neuro/Psych bladder control problems bowel control problems depression anxiety  Prior Studies new eval  Physicians involved in your care new eval   Family History  Problem Relation Age of Onset  . Breast cancer Paternal Aunt   . Diabetes Maternal Grandmother   . Diabetes Father   . Colon cancer Neg Hx   . Esophageal cancer Neg Hx   . Stomach cancer Neg Hx    . Rectal cancer Neg Hx   . Colon polyps Mother    Social History   Social History  . Marital status: Single    Spouse name: N/A  . Number of children: 0  . Years of education: N/A   Occupational History  . Disabled    Social History Main Topics  . Smoking status: Never Smoker  . Smokeless tobacco: Never Used  . Alcohol use Yes     Comment: occasional  . Drug use: No  . Sexual activity: Not Asked   Other Topics Concern  . None   Social History Narrative  . None   Past Surgical History:  Procedure Laterality Date  . BACK SURGERY    . BREATH TEK H PYLORI N/A 12/09/2012   Procedure: BREATH TEK H PYLORI;  Surgeon: Lafayette Dragon, MD;  Location: [facility];  Service: Endoscopy;  Laterality: N/A;  . ESOPHAGOGASTRODUODENOSCOPY N/A 10/06/2012   Procedure: ESOPHAGOGASTRODUODENOSCOPY (EGD);  Surgeon: Lafayette Dragon, MD;  Location: [facility];  Service: Endoscopy;  Laterality: N/A;  patient is paraplegic  . UPPER GASTROINTESTINAL ENDOSCOPY     Past Medical History:  Diagnosis Date  . Anxiety   . Helicobacter pylori (H. pylori) infection   . Paraplegia ([place])   . Rectal incontinence   . Spinal injury    BP 106/71 (BP Location: Left Arm, Patient Position: Sitting, Cuff Size: Normal)   Pulse 63   Resp 14   SpO2 99%   Opioid Risk Score:  Fall Risk Score:  `1  Depression screen PHQ 2/9  No flowsheet data found. Review of Systems  Constitutional: Negative.   HENT: Negative.   Eyes: Negative.   Respiratory: Negative.   Cardiovascular: Negative.   Endocrine: Negative.   Genitourinary: Negative.        Neurogenic bladder  Allergic/Immunologic: Negative.   Neurological: Negative.   Hematological: Negative.   Psychiatric/Behavioral: Positive for dysphoric mood. The patient is nervous/anxious.   All other systems reviewed and are negative.      Objective:   Physical Exam  Constitutional: She is oriented to person, place, and time. She appears well-developed and  well-nourished.  HENT:  Head: Normocephalic and atraumatic.  Eyes: Conjunctivae and EOM are normal. Pupils are equal, round, and reactive to light.  Neck: Normal range of motion.  Cardiovascular: Normal rate, regular rhythm and normal heart sounds.   No murmur heard. Pulmonary/Chest: Effort normal and breath sounds normal. No respiratory distress. She has no wheezes.  Abdominal: Soft. Bowel sounds are normal. She exhibits no distension. There is no tenderness.  Musculoskeletal:       Right shoulder: She exhibits normal range of motion.       Left shoulder: She exhibits normal range of motion.       Right elbow: Normal.      Left elbow: Normal.       Right wrist: Normal.       Left wrist: Normal.       Right hip: She exhibits decreased range of motion.       Left hip: She exhibits decreased range of motion.       Right knee: She exhibits decreased range of motion.       Left knee: She exhibits decreased range of motion. She exhibits no swelling and no effusion.       Right ankle: Normal.       Left ankle: Normal.  Bilateral knee contractures 5-10     Neurological: She is alert and oriented to person, place, and time. A sensory deficit is present. Gait abnormal.  Reflex Scores:      Tricep reflexes are 2+ on the right side and 2+ on the left side.      Bicep reflexes are 2+ on the right side and 2+ on the left side.      Brachioradialis reflexes are 2+ on the right side and 2+ on the left side.      Patellar reflexes are 0 on the right side and 0 on the left side.      Achilles reflexes are 0 on the right side and 0 on the left side. Absent sensation below T12 Motor strength is absent. Bilateral hip flexor, knee extensor, ankle dorsi flexion, plantar flexor 5/5 strength, bilateral deltoid, biceps, triceps, grip  No evidence of spasticity in the lower extremities   Psychiatric: She has a normal mood and affect.  Nursing note and vitals reviewed.         Assessment & Plan:   1. T12 [place] a spinal cord injury with neurogenic bowel, bladder  Recommend yearly kidney ultrasound follow-up with urology. Recommend urodynamic testing for baseline follow-up with urology, [facility], already has contact  2. Osteoporosis. Bilateral hips. She would like to discuss osteoporosis management in long-term spinal cord injury. We discussed that she may be best served by spinal cord injury specialist. We'll make referral to [facility] I do think her 1 hour a day standing frame is important as well as adequate  calcium intake as well as UV exposure for vitamin D 20 minutes per day I don't have any specific recommendations in terms. Osteoporosis. Medications would defer to subspecialist in that area.  3. Mobility issues, in need of a new manual wheelchair. I think this is the better way to go rather than trying to get a power chair given. She still has good functioning of her upper extremities. We'll make arrangements for wheelchair seating clinic here in [place]

## 2015-11-18 NOTE — Patient Instructions (Signed)
We'll make a referral to Dr. Natale Milch or one of his associates at [facility]

## 2015-12-17 DIAGNOSIS — F331 Major depressive disorder, recurrent, moderate: Secondary | ICD-10-CM | POA: Diagnosis not present

## 2015-12-31 DIAGNOSIS — F331 Major depressive disorder, recurrent, moderate: Secondary | ICD-10-CM | POA: Diagnosis not present

## 2016-01-14 ENCOUNTER — Other Ambulatory Visit: Payer: Self-pay | Admitting: Physical Medicine & Rehabilitation

## 2016-01-28 DIAGNOSIS — F331 Major depressive disorder, recurrent, moderate: Secondary | ICD-10-CM | POA: Diagnosis not present

## 2016-02-10 DIAGNOSIS — F331 Major depressive disorder, recurrent, moderate: Secondary | ICD-10-CM | POA: Diagnosis not present

## 2016-02-25 DIAGNOSIS — F331 Major depressive disorder, recurrent, moderate: Secondary | ICD-10-CM | POA: Diagnosis not present

## 2016-03-11 DIAGNOSIS — F331 Major depressive disorder, recurrent, moderate: Secondary | ICD-10-CM | POA: Diagnosis not present

## 2016-03-18 DIAGNOSIS — F331 Major depressive disorder, recurrent, moderate: Secondary | ICD-10-CM | POA: Diagnosis not present

## 2016-04-07 DIAGNOSIS — F331 Major depressive disorder, recurrent, moderate: Secondary | ICD-10-CM | POA: Diagnosis not present

## 2016-04-14 DIAGNOSIS — F331 Major depressive disorder, recurrent, moderate: Secondary | ICD-10-CM | POA: Diagnosis not present

## 2016-04-30 DIAGNOSIS — F331 Major depressive disorder, recurrent, moderate: Secondary | ICD-10-CM | POA: Diagnosis not present

## 2016-05-19 ENCOUNTER — Other Ambulatory Visit: Payer: Self-pay | Admitting: Physical Medicine & Rehabilitation

## 2016-05-20 NOTE — Telephone Encounter (Signed)
Patient requesting a refill of ditropan, has not been seen in clinic since 9/17, no future appointments and no mention of continuing this medication, please advise

## 2016-05-20 NOTE — Telephone Encounter (Signed)
Please have patient obtain this from her primary care physician

## 2016-05-21 DIAGNOSIS — F331 Major depressive disorder, recurrent, moderate: Secondary | ICD-10-CM | POA: Diagnosis not present

## 2016-06-03 DIAGNOSIS — F331 Major depressive disorder, recurrent, moderate: Secondary | ICD-10-CM | POA: Diagnosis not present

## 2016-06-23 DIAGNOSIS — F331 Major depressive disorder, recurrent, moderate: Secondary | ICD-10-CM | POA: Diagnosis not present

## 2016-07-01 DIAGNOSIS — F331 Major depressive disorder, recurrent, moderate: Secondary | ICD-10-CM | POA: Diagnosis not present

## 2016-07-08 DIAGNOSIS — N319 Neuromuscular dysfunction of bladder, unspecified: Secondary | ICD-10-CM | POA: Diagnosis not present

## 2016-07-08 DIAGNOSIS — Z993 Dependence on wheelchair: Secondary | ICD-10-CM | POA: Diagnosis not present

## 2016-07-08 DIAGNOSIS — G8220 Paraplegia, unspecified: Secondary | ICD-10-CM | POA: Diagnosis not present

## 2016-07-08 DIAGNOSIS — K592 Neurogenic bowel, not elsewhere classified: Secondary | ICD-10-CM | POA: Diagnosis not present

## 2016-07-13 ENCOUNTER — Encounter: Payer: Self-pay | Admitting: Gastroenterology

## 2016-07-13 DIAGNOSIS — F331 Major depressive disorder, recurrent, moderate: Secondary | ICD-10-CM | POA: Diagnosis not present

## 2016-07-22 DIAGNOSIS — Z993 Dependence on wheelchair: Secondary | ICD-10-CM | POA: Diagnosis not present

## 2016-07-22 DIAGNOSIS — G8220 Paraplegia, unspecified: Secondary | ICD-10-CM | POA: Diagnosis not present

## 2016-07-22 DIAGNOSIS — N319 Neuromuscular dysfunction of bladder, unspecified: Secondary | ICD-10-CM | POA: Diagnosis not present

## 2016-07-22 DIAGNOSIS — K592 Neurogenic bowel, not elsewhere classified: Secondary | ICD-10-CM | POA: Diagnosis not present

## 2016-07-27 DIAGNOSIS — Z993 Dependence on wheelchair: Secondary | ICD-10-CM | POA: Diagnosis not present

## 2016-07-27 DIAGNOSIS — G8220 Paraplegia, unspecified: Secondary | ICD-10-CM | POA: Diagnosis not present

## 2016-07-27 DIAGNOSIS — S24104S Unspecified injury at T11-T12 level of thoracic spinal cord, sequela: Secondary | ICD-10-CM | POA: Diagnosis not present

## 2016-07-27 DIAGNOSIS — M8580 Other specified disorders of bone density and structure, unspecified site: Secondary | ICD-10-CM | POA: Diagnosis not present

## 2016-07-29 ENCOUNTER — Ambulatory Visit: Payer: Medicare Other | Attending: Physical Medicine & Rehabilitation | Admitting: Physical Therapy

## 2016-07-29 DIAGNOSIS — R2689 Other abnormalities of gait and mobility: Secondary | ICD-10-CM | POA: Diagnosis not present

## 2016-07-29 DIAGNOSIS — M6281 Muscle weakness (generalized): Secondary | ICD-10-CM | POA: Diagnosis not present

## 2016-07-30 DIAGNOSIS — F331 Major depressive disorder, recurrent, moderate: Secondary | ICD-10-CM | POA: Diagnosis not present

## 2016-07-30 NOTE — Therapy (Signed)
[facility] [address] [place], [place], [address] Phone: [phone]   Fax:  [phone]  Physical Therapy Evaluation  Patient Details  Name: Catherine Oneal MRN: [id number] Date of Birth: [birth year] Referring Provider: Dr. Almond Lint  Encounter Date: 07/29/2016      PT End of Session - 07/30/16 06-18-43    Visit Number 1   Number of Visits 1   Authorization Type Medicare   PT Start Time 3159   PT Stop Time 1612   PT Time Calculation (min) 77 min      Past Medical History:  Diagnosis Date  . Anxiety   . Helicobacter pylori (H. pylori) infection   . Paraplegia ([place])   . Rectal incontinence   . Spinal injury     Past Surgical History:  Procedure Laterality Date  . BACK SURGERY    . BREATH TEK H PYLORI N/A 12/09/2012   Procedure: BREATH TEK H PYLORI;  Surgeon: Lafayette Dragon, MD;  Location: [facility];  Service: Endoscopy;  Laterality: N/A;  . ESOPHAGOGASTRODUODENOSCOPY N/A 10/06/2012   Procedure: ESOPHAGOGASTRODUODENOSCOPY (EGD);  Surgeon: Lafayette Dragon, MD;  Location: [facility];  Service: Endoscopy;  Laterality: N/A;  patient is paraplegic  . UPPER GASTROINTESTINAL ENDOSCOPY      There were no vitals filed for this visit.           [facility] PT Assessment - 07/30/16 0001      Assessment   Medical Diagnosis Paraplegia due T10 SCI   Referring Provider Dr. Almond Lint   Onset Date/Surgical Date --  June 17, 1989             LMN will be completed after quote received from [facility], ATP with [employer] who consulted with manual wheelchair eval -  Recommend K5 ultra lightweight manual wheelchair                           Plan - 07/30/16 2045/06/17    Clinical Impression Statement Pt presents for manual wheelchair evaluation due to T10 SCI with paraplegia:  Deberah Pelton, ATP from [employer] present; LMN to be completed after quote received from vendor   PT Frequency One time  visit   PT Duration --  wheelchair eval only   PT Treatment/Interventions Patient/family education  wheelchair management   Consulted and Agree with Plan of Care Patient      Patient will benefit from skilled therapeutic intervention in order to improve the following deficits and impairments:  Decreased strength, Decreased mobility  Visit Diagnosis: Muscle weakness (generalized) - Plan: PT plan of care cert/re-cert  Other abnormalities of gait and mobility - Plan: PT plan of care cert/re-cert      G-Codes - 45/85/92 17-Jun-2048    Functional Assessment Tool Used (Outpatient Only) pt is nonambulatory - using manual wheelchair for mobility   Functional Limitation Mobility: Walking and moving around   Mobility: Walking and Moving Around Current Status [phone]) At least 80 percent but less than 100 percent impaired, limited or restricted   Mobility: Walking and Moving Around Goal Status [phone]) At least 80 percent but less than 100 percent impaired, limited or restricted   Mobility: Walking and Moving Around Discharge Status [phone]) At least 80 percent but less than 100 percent impaired, limited or restricted       Problem List Patient Active Problem List   Diagnosis Date Noted  . Other specified gastritis without mention of hemorrhage 10/06/2012  .  DEPRESSION 07/02/2007  . PARAPLEGIA 07/02/2007  . COLITIS 07/02/2007  . RECTAL BLEEDING 07/02/2007  . RECTAL INCONTINENCE 07/02/2007  . SPINAL CORD INJURY 07/02/2007    Alda Lea, PT 07/30/2016, 8:56 PM  [facility] [address] [place], [place], [address] Phone: [phone]   Fax:  [phone]  Name: Catherine Oneal MRN: [id number] Date of Birth: 23-May-1960

## 2016-08-12 DIAGNOSIS — F331 Major depressive disorder, recurrent, moderate: Secondary | ICD-10-CM | POA: Diagnosis not present

## 2016-08-18 DIAGNOSIS — M8580 Other specified disorders of bone density and structure, unspecified site: Secondary | ICD-10-CM | POA: Diagnosis not present

## 2016-08-18 DIAGNOSIS — G8220 Paraplegia, unspecified: Secondary | ICD-10-CM | POA: Diagnosis not present

## 2016-08-18 DIAGNOSIS — S24104S Unspecified injury at T11-T12 level of thoracic spinal cord, sequela: Secondary | ICD-10-CM | POA: Diagnosis not present

## 2016-08-18 DIAGNOSIS — Z993 Dependence on wheelchair: Secondary | ICD-10-CM | POA: Diagnosis not present

## 2016-08-19 DIAGNOSIS — F331 Major depressive disorder, recurrent, moderate: Secondary | ICD-10-CM | POA: Diagnosis not present

## 2016-08-25 DIAGNOSIS — Z993 Dependence on wheelchair: Secondary | ICD-10-CM | POA: Diagnosis not present

## 2016-08-25 DIAGNOSIS — S24104S Unspecified injury at T11-T12 level of thoracic spinal cord, sequela: Secondary | ICD-10-CM | POA: Diagnosis not present

## 2016-08-25 DIAGNOSIS — G8220 Paraplegia, unspecified: Secondary | ICD-10-CM | POA: Diagnosis not present

## 2016-08-25 DIAGNOSIS — M8580 Other specified disorders of bone density and structure, unspecified site: Secondary | ICD-10-CM | POA: Diagnosis not present

## 2016-08-31 DIAGNOSIS — F411 Generalized anxiety disorder: Secondary | ICD-10-CM | POA: Diagnosis not present

## 2016-09-08 DIAGNOSIS — Z87828 Personal history of other (healed) physical injury and trauma: Secondary | ICD-10-CM | POA: Diagnosis not present

## 2016-09-08 DIAGNOSIS — N319 Neuromuscular dysfunction of bladder, unspecified: Secondary | ICD-10-CM | POA: Diagnosis not present

## 2016-09-08 DIAGNOSIS — Z993 Dependence on wheelchair: Secondary | ICD-10-CM | POA: Diagnosis not present

## 2016-09-08 DIAGNOSIS — G8220 Paraplegia, unspecified: Secondary | ICD-10-CM | POA: Diagnosis not present

## 2016-09-08 DIAGNOSIS — M8580 Other specified disorders of bone density and structure, unspecified site: Secondary | ICD-10-CM | POA: Diagnosis not present

## 2016-09-08 DIAGNOSIS — N1330 Unspecified hydronephrosis: Secondary | ICD-10-CM | POA: Diagnosis not present

## 2016-09-08 DIAGNOSIS — S24104S Unspecified injury at T11-T12 level of thoracic spinal cord, sequela: Secondary | ICD-10-CM | POA: Diagnosis not present

## 2016-09-10 DIAGNOSIS — F331 Major depressive disorder, recurrent, moderate: Secondary | ICD-10-CM | POA: Diagnosis not present

## 2016-09-15 DIAGNOSIS — S24104S Unspecified injury at T11-T12 level of thoracic spinal cord, sequela: Secondary | ICD-10-CM | POA: Diagnosis not present

## 2016-09-15 DIAGNOSIS — G8220 Paraplegia, unspecified: Secondary | ICD-10-CM | POA: Diagnosis not present

## 2016-09-15 DIAGNOSIS — M8580 Other specified disorders of bone density and structure, unspecified site: Secondary | ICD-10-CM | POA: Diagnosis not present

## 2016-09-15 DIAGNOSIS — Z993 Dependence on wheelchair: Secondary | ICD-10-CM | POA: Diagnosis not present

## 2016-09-21 DIAGNOSIS — F331 Major depressive disorder, recurrent, moderate: Secondary | ICD-10-CM | POA: Diagnosis not present

## 2016-09-22 DIAGNOSIS — M8580 Other specified disorders of bone density and structure, unspecified site: Secondary | ICD-10-CM | POA: Diagnosis not present

## 2016-09-22 DIAGNOSIS — S24104S Unspecified injury at T11-T12 level of thoracic spinal cord, sequela: Secondary | ICD-10-CM | POA: Diagnosis not present

## 2016-09-22 DIAGNOSIS — G8220 Paraplegia, unspecified: Secondary | ICD-10-CM | POA: Diagnosis not present

## 2016-09-22 DIAGNOSIS — Z993 Dependence on wheelchair: Secondary | ICD-10-CM | POA: Diagnosis not present

## 2016-10-13 DIAGNOSIS — F331 Major depressive disorder, recurrent, moderate: Secondary | ICD-10-CM | POA: Diagnosis not present

## 2016-10-26 DIAGNOSIS — G8220 Paraplegia, unspecified: Secondary | ICD-10-CM | POA: Diagnosis not present

## 2016-10-26 DIAGNOSIS — Z993 Dependence on wheelchair: Secondary | ICD-10-CM | POA: Diagnosis not present

## 2016-10-26 DIAGNOSIS — M8580 Other specified disorders of bone density and structure, unspecified site: Secondary | ICD-10-CM | POA: Diagnosis not present

## 2016-10-26 DIAGNOSIS — S24104S Unspecified injury at T11-T12 level of thoracic spinal cord, sequela: Secondary | ICD-10-CM | POA: Diagnosis not present

## 2016-10-27 DIAGNOSIS — F331 Major depressive disorder, recurrent, moderate: Secondary | ICD-10-CM | POA: Diagnosis not present

## 2016-11-10 DIAGNOSIS — F331 Major depressive disorder, recurrent, moderate: Secondary | ICD-10-CM | POA: Diagnosis not present

## 2016-11-17 DIAGNOSIS — F411 Generalized anxiety disorder: Secondary | ICD-10-CM | POA: Diagnosis not present

## 2016-12-03 DIAGNOSIS — F331 Major depressive disorder, recurrent, moderate: Secondary | ICD-10-CM | POA: Diagnosis not present

## 2016-12-21 DIAGNOSIS — F331 Major depressive disorder, recurrent, moderate: Secondary | ICD-10-CM | POA: Diagnosis not present

## 2017-01-07 DIAGNOSIS — M8580 Other specified disorders of bone density and structure, unspecified site: Secondary | ICD-10-CM | POA: Diagnosis not present

## 2017-01-07 DIAGNOSIS — G8220 Paraplegia, unspecified: Secondary | ICD-10-CM | POA: Diagnosis not present

## 2017-01-07 DIAGNOSIS — Z993 Dependence on wheelchair: Secondary | ICD-10-CM | POA: Diagnosis not present

## 2017-01-07 DIAGNOSIS — S24104S Unspecified injury at T11-T12 level of thoracic spinal cord, sequela: Secondary | ICD-10-CM | POA: Diagnosis not present

## 2017-01-13 DIAGNOSIS — F331 Major depressive disorder, recurrent, moderate: Secondary | ICD-10-CM | POA: Diagnosis not present

## 2017-02-08 DIAGNOSIS — F331 Major depressive disorder, recurrent, moderate: Secondary | ICD-10-CM | POA: Diagnosis not present

## 2017-02-22 DIAGNOSIS — F411 Generalized anxiety disorder: Secondary | ICD-10-CM | POA: Diagnosis not present

## 2017-02-23 DIAGNOSIS — F331 Major depressive disorder, recurrent, moderate: Secondary | ICD-10-CM | POA: Diagnosis not present

## 2017-03-05 ENCOUNTER — Telehealth: Payer: Self-pay | Admitting: Physical Therapy

## 2017-03-05 ENCOUNTER — Ambulatory Visit
Payer: Medicare Other | Attending: Student in an Organized Health Care Education/Training Program | Admitting: Physical Therapy

## 2017-03-05 NOTE — Telephone Encounter (Signed)
Pt was scheduled for 12:00 wheelchair evaluation today (03-05-17) which she had confirmed via email to [employer].  She had not arrived by 12:00 for this appt.  Pt was contacted via telephone by Deberah Pelton, ATP with [employer] at 12:14 pm; she stated she thought appt time was 12:30 and that she was getting ready to leave for this appt. Pt was instructed to proceed with coming and that we would wait for her.  She called Erlene Quan back at 12:21 and stated she suddenly did not feel well and asked for this appt to be rescheduled. Pt states she is going out of town soon for a wedding and requested that appt be rescheduled for next week (first week in Jan. 2019)  Pt was informed that she would be contacted with availability of times for this appt to be rescheduled.   Guido Sander, PT

## 2017-03-22 ENCOUNTER — Other Ambulatory Visit: Payer: Self-pay | Admitting: *Deleted

## 2017-03-30 ENCOUNTER — Ambulatory Visit
Payer: Medicare Other | Attending: Student in an Organized Health Care Education/Training Program | Admitting: Physical Therapy

## 2017-03-30 ENCOUNTER — Ambulatory Visit: Payer: Medicare Other | Admitting: Family Medicine

## 2017-03-30 DIAGNOSIS — F331 Major depressive disorder, recurrent, moderate: Secondary | ICD-10-CM | POA: Diagnosis not present

## 2017-04-08 DIAGNOSIS — F331 Major depressive disorder, recurrent, moderate: Secondary | ICD-10-CM | POA: Diagnosis not present

## 2017-04-13 ENCOUNTER — Other Ambulatory Visit: Payer: Self-pay

## 2017-04-13 ENCOUNTER — Encounter: Payer: Self-pay | Admitting: Family Medicine

## 2017-04-13 ENCOUNTER — Ambulatory Visit (INDEPENDENT_AMBULATORY_CARE_PROVIDER_SITE_OTHER): Payer: Medicare Other | Admitting: Family Medicine

## 2017-04-13 VITALS — BP 92/70 | HR 87 | Temp 98.0°F | Ht 69.0 in | Wt 135.0 lb

## 2017-04-13 DIAGNOSIS — G4700 Insomnia, unspecified: Secondary | ICD-10-CM | POA: Insufficient documentation

## 2017-04-13 DIAGNOSIS — K592 Neurogenic bowel, not elsewhere classified: Secondary | ICD-10-CM

## 2017-04-13 DIAGNOSIS — Z23 Encounter for immunization: Secondary | ICD-10-CM | POA: Diagnosis not present

## 2017-04-13 DIAGNOSIS — N319 Neuromuscular dysfunction of bladder, unspecified: Secondary | ICD-10-CM | POA: Diagnosis not present

## 2017-04-13 DIAGNOSIS — F39 Unspecified mood [affective] disorder: Secondary | ICD-10-CM

## 2017-04-13 DIAGNOSIS — Z1159 Encounter for screening for other viral diseases: Secondary | ICD-10-CM

## 2017-04-13 DIAGNOSIS — F419 Anxiety disorder, unspecified: Secondary | ICD-10-CM | POA: Diagnosis not present

## 2017-04-13 DIAGNOSIS — E559 Vitamin D deficiency, unspecified: Secondary | ICD-10-CM | POA: Diagnosis not present

## 2017-04-13 DIAGNOSIS — Z87828 Personal history of other (healed) physical injury and trauma: Secondary | ICD-10-CM | POA: Diagnosis not present

## 2017-04-13 DIAGNOSIS — Z136 Encounter for screening for cardiovascular disorders: Secondary | ICD-10-CM

## 2017-04-13 DIAGNOSIS — Z1212 Encounter for screening for malignant neoplasm of rectum: Secondary | ICD-10-CM

## 2017-04-13 DIAGNOSIS — Z1211 Encounter for screening for malignant neoplasm of colon: Secondary | ICD-10-CM

## 2017-04-13 DIAGNOSIS — G8220 Paraplegia, unspecified: Secondary | ICD-10-CM | POA: Diagnosis not present

## 2017-04-13 DIAGNOSIS — M816 Localized osteoporosis [Lequesne]: Secondary | ICD-10-CM | POA: Diagnosis not present

## 2017-04-13 LAB — CBC WITH DIFFERENTIAL/PLATELET
Basophils Absolute: 0 10*3/uL (ref 0.0–0.1)
Basophils Relative: 0.6 % (ref 0.0–3.0)
EOS PCT: 0.6 % (ref 0.0–5.0)
Eosinophils Absolute: 0 10*3/uL (ref 0.0–0.7)
HEMATOCRIT: 41.1 % (ref 36.0–46.0)
HEMOGLOBIN: 13.4 g/dL (ref 12.0–15.0)
LYMPHS PCT: 31.9 % (ref 12.0–46.0)
Lymphs Abs: 2.4 10*3/uL (ref 0.7–4.0)
MCHC: 32.5 g/dL (ref 30.0–36.0)
MCV: 96.9 fl (ref 78.0–100.0)
MONOS PCT: 5 % (ref 3.0–12.0)
Monocytes Absolute: 0.4 10*3/uL (ref 0.1–1.0)
Neutro Abs: 4.7 10*3/uL (ref 1.4–7.7)
Neutrophils Relative %: 61.9 % (ref 43.0–77.0)
Platelets: 318 10*3/uL (ref 150.0–400.0)
RBC: 4.24 Mil/uL (ref 3.87–5.11)
RDW: 12.3 % (ref 11.5–15.5)
WBC: 7.6 10*3/uL (ref 4.0–10.5)

## 2017-04-13 LAB — LIPID PANEL
CHOLESTEROL: 152 mg/dL (ref 0–200)
HDL: 57.4 mg/dL (ref >39.00)
LDL CALC: 74 mg/dL (ref 0–99)
NonHDL: 94.88
Total CHOL/HDL Ratio: 3
Triglycerides: 102 mg/dL (ref 0.0–149.0)
VLDL: 20.4 mg/dL (ref 0.0–40.0)

## 2017-04-13 LAB — COMPREHENSIVE METABOLIC PANEL
ALBUMIN: 4.5 g/dL (ref 3.5–5.2)
ALK PHOS: 76 U/L (ref 39–117)
ALT: 13 U/L (ref 0–35)
AST: 16 U/L (ref 0–37)
BUN: 17 mg/dL (ref 6–23)
CO2: 28 mEq/L (ref 19–32)
Calcium: 9.8 mg/dL (ref 8.4–10.5)
Chloride: 104 mEq/L (ref 96–112)
Creatinine, Ser: 0.62 mg/dL (ref 0.40–1.20)
GFR: 105.7 mL/min (ref >60.00)
Glucose, Bld: 80 mg/dL (ref 70–99)
POTASSIUM: 4.4 meq/L (ref 3.5–5.1)
Sodium: 139 mEq/L (ref 135–145)
TOTAL PROTEIN: 7.4 g/dL (ref 6.0–8.3)
Total Bilirubin: 0.6 mg/dL (ref 0.2–1.2)

## 2017-04-13 LAB — VITAMIN D 25 HYDROXY (VIT D DEFICIENCY, FRACTURES): VITD: 21.19 ng/mL — AB (ref 30.00–100.00)

## 2017-04-13 NOTE — Patient Instructions (Signed)
Please return in 6 months for recheck.  I will release your lab results to you on your MyChart account with further instructions. Please reply with any questions.   Medicare recommends an Annual Wellness Visit for all patients. Please schedule this to be done with our Nurse Educator, Maudie Mercury. This is an informative "talk" visit; it's goals are to ensure that your health care needs are being met and to give you education regarding avoiding falls, ensuring you are not suffering from depression or problems with memory or thinking, and to educate you on Advance Care Planning. It helps me take good care of you!  We will call you with information regarding your referral appointment. Gastroenterologist Please make an appointment for your mammogram with [facility].   It was a pleasure meeting you today! Thank you for choosing [place] to meet your healthcare needs! I truly look forward to working with you. If you have any questions or concerns, please send me a message via Mychart or call the office at [phone].  Please do these things to maintain good health!   Exercise at least 30-45 minutes a day,  4-5 days a week.   Eat a low-fat diet with lots of fruits and vegetables, up to 7-9 servings per day.  Drink plenty of water daily. Try to drink 8 8oz glasses per day.  Seatbelts can save your life. Always wear your seatbelt.  Place Smoke Detectors on every level of your home and check batteries every year.  Schedule an appointment with an eye doctor for an eye exam every 1-2 years  Safe sex - use condoms to protect yourself from STDs if you could be exposed to these types of infections. Use birth control if you do not want to become pregnant and are sexually active.  Avoid heavy alcohol use. If you drink, keep it to less than 2 drinks/day and not every day.  [place].  Choose someone you trust that could speak for you if you became unable to speak for yourself.  Depression is common in  our stressful world.If you're feeling down or losing interest in things you normally enjoy, please come in for a visit.  If anyone is threatening or hurting you, please get help. Physical or Emotional Violence is never OK.   Please read the patient education material about Living [facility] and [place].    Both the Living Will and [place] require a Insurance account manager to [employer] on either document.  You may complete just one or both or neither of the documents. It is voluntaary. However, it can be very helpful if needed and I strongly encourage you to complete both forms stating your Advanced Directives. This will help me and your other healthcare providers care for you.  If you choose to complete either or both documents, please bring in or mail copies of the completed documents to my office so that I may put them in your medical chart. This way will ensure we have them if needed.
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
## 2017-04-14 LAB — HEPATITIS C ANTIBODY
HEP C AB: NONREACTIVE
SIGNAL TO CUT-OFF: 0.01 (ref <1.00)

## 2017-04-16 NOTE — Progress Notes (Signed)
Please call patient: I have reviewed his/her lab results. All labs look good but Vit D level is a little low. Start taking the Caltrate plus twice a day to raise these levels.  Send copy of results in lab letter if she'd like as well. Thanks.

## 2017-04-22 DIAGNOSIS — F331 Major depressive disorder, recurrent, moderate: Secondary | ICD-10-CM | POA: Diagnosis not present

## 2017-05-05 ENCOUNTER — Encounter: Payer: Self-pay | Admitting: Family Medicine

## 2017-05-06 DIAGNOSIS — F331 Major depressive disorder, recurrent, moderate: Secondary | ICD-10-CM | POA: Diagnosis not present

## 2017-05-13 ENCOUNTER — Encounter: Payer: Self-pay | Admitting: Family Medicine

## 2017-05-17 DIAGNOSIS — F411 Generalized anxiety disorder: Secondary | ICD-10-CM | POA: Diagnosis not present

## 2017-05-20 DIAGNOSIS — F331 Major depressive disorder, recurrent, moderate: Secondary | ICD-10-CM | POA: Diagnosis not present

## 2017-05-27 ENCOUNTER — Telehealth: Payer: Self-pay | Admitting: Gastroenterology

## 2017-05-27 NOTE — Telephone Encounter (Signed)
Is it okay to schedule without an office visit?

## 2017-05-28 ENCOUNTER — Ambulatory Visit (INDEPENDENT_AMBULATORY_CARE_PROVIDER_SITE_OTHER): Payer: Medicare Other | Admitting: Family Medicine

## 2017-05-28 ENCOUNTER — Encounter: Payer: Self-pay | Admitting: Family Medicine

## 2017-05-28 ENCOUNTER — Ambulatory Visit
Payer: Medicare Other | Attending: Student in an Organized Health Care Education/Training Program | Admitting: Physical Therapy

## 2017-05-28 ENCOUNTER — Other Ambulatory Visit: Payer: Self-pay

## 2017-05-28 VITALS — BP 102/78 | HR 97 | Temp 99.0°F | Ht 69.0 in | Wt 130.0 lb

## 2017-05-28 DIAGNOSIS — H9313 Tinnitus, bilateral: Secondary | ICD-10-CM | POA: Diagnosis not present

## 2017-05-28 DIAGNOSIS — T17908A Unspecified foreign body in respiratory tract, part unspecified causing other injury, initial encounter: Secondary | ICD-10-CM | POA: Diagnosis not present

## 2017-05-28 DIAGNOSIS — R112 Nausea with vomiting, unspecified: Secondary | ICD-10-CM

## 2017-05-28 DIAGNOSIS — R2689 Other abnormalities of gait and mobility: Secondary | ICD-10-CM | POA: Insufficient documentation

## 2017-05-28 DIAGNOSIS — M6281 Muscle weakness (generalized): Secondary | ICD-10-CM | POA: Diagnosis not present

## 2017-05-28 NOTE — Patient Instructions (Signed)
Tinnitus Tinnitus refers to hearing a sound when there is no actual source for that sound. This is often described as ringing in the ears. However, people with this condition may hear a variety of noises. A person may hear the sound in one ear or in both ears. The sounds of tinnitus can be soft, loud, or somewhere in between. Tinnitus can last for a few seconds or can be constant for days. It may go away without treatment and come back at various times. When tinnitus is constant or happens often, it can lead to other problems, such as trouble sleeping and trouble concentrating. Almost everyone experiences tinnitus at some point. Tinnitus that is long-lasting (chronic) or comes back often is a problem that may require medical attention. What are the causes? The cause of tinnitus is often not known. In some cases, it can result from other problems or conditions, including:  Exposure to loud noises from machinery, music, or other sources.  Hearing loss.  Ear or sinus infections.  Earwax buildup.  A foreign object in the ear.  Use of certain medicines.  Use of alcohol and caffeine.  High blood pressure.  Heart diseases.  Anemia.  Allergies.  Meniere disease.  Thyroid problems.  Tumors.  An enlarged part of a weakened blood vessel (aneurysm).  What are the signs or symptoms? The main symptom of tinnitus is hearing a sound when there is no source for that sound. It may sound like:  Buzzing.  Roaring.  Ringing.  Blowing air, similar to the sound heard when you listen to a seashell.  Hissing.  Whistling.  Sizzling.  Humming.  Running water.  A sustained musical note.  How is this diagnosed? Tinnitus is diagnosed based on your symptoms. Your health care provider will do a physical exam. A comprehensive hearing exam (audiologic exam) will be done if your tinnitus:  Affects only one ear (unilateral).  Causes hearing difficulties.  Lasts 6 months or  longer.  You may also need to see a health care provider who specializes in hearing disorders (audiologist). You may be asked to complete a questionnaire to determine the severity of your tinnitus. Tests may be done to help determine the cause and to rule out other conditions. These can include:  Imaging studies of your head and brain, such as: ? A CT scan. ? An MRI.  An imaging study of your blood vessels (angiogram).  How is this treated? Treating an underlying medical condition can sometimes make tinnitus go away. If your tinnitus continues, other treatments may include:  Medicines, such as certain antidepressants or sleeping aids.  Sound generators to mask the tinnitus. These include: ? Tabletop sound machines that play relaxing sounds to help you fall asleep. ? Wearable devices that fit in your ear and play sounds or music. ? A small device that uses headphones to deliver a signal embedded in music (acoustic neural stimulation). In time, this may change the pathways of your brain and make you less sensitive to tinnitus. This device is used for very severe cases when no other treatment is working.  Therapy and counseling to help you manage the stress of living with tinnitus.  Using hearing aids or cochlear implants, if your tinnitus is related to hearing loss.  Follow these instructions at home:  When possible, avoid being in loud places and being exposed to loud sounds.  Wear hearing protection, such as earplugs, when you are exposed to loud noises.  Do not take stimulants, such as nicotine,   alcohol, or caffeine.  Practice techniques for reducing stress, such as meditation, yoga, or deep breathing.  Use a white noise machine, a humidifier, or other devices to mask the sound of tinnitus.  Sleep with your head slightly raised. This may reduce the impact of tinnitus.  Try to get plenty of rest each night. Contact a health care provider if:  You have tinnitus in just one  ear.  Your tinnitus continues for 3 weeks or longer without stopping.  Home care measures are not helping.  You have tinnitus after a head injury.  You have tinnitus along with any of the following: ? Dizziness. ? Loss of balance. ? Nausea and vomiting. This information is not intended to replace advice given to you by your health care provider. Make sure you discuss any questions you have with your health care provider. Document Released: 02/23/2005 Document Revised: 10/27/2015 Document Reviewed: 07/26/2013 Elsevier Interactive Patient Education  2018 Elsevier Inc.  

## 2017-05-28 NOTE — Progress Notes (Signed)
Subjective  CC:  Chief Complaint  Patient presents with  . Tinnitus    only at night, doesnt notice during the day   . Emesis    n/v last week     HPI: Catherine [place] is a 57 y.o. female who presents to the office today to address the problems listed above in the chief complaint.  About 6 months of tinnitus, L>R w/o pain, h/o trauma, hearing loss. She suspects it is due to wax. No headaches.   Last weekend, had abrupt episode on n/v awakening her from sleep - had terrifying episode: couldn't breathe, seemed to be choking; had labored breathing that sounded "wet" for two hours afterwards. Fortunately, sxs resolved. She never sought help. Now, feels normal. She is unclear about what happened. No abdominal pain, n/v or sob or cough now. Has fecal incontinence ongoing.   I reviewed the patients updated PMH, FH, and SocHx.    Patient Active Problem List   Diagnosis Date Noted  . Neurogenic bowel 11/08/2013  . Anxiety 05/21/2013  . Urinary incontinence 05/09/2013  . Neurogenic bladder 11/14/2012  . Vitamin D deficiency 10/29/2011  . Osteoporosis 10/22/2011  . Episodic mood disorder ([place]) 07/02/2007  . PARAPLEGIA 07/02/2007  . RECTAL INCONTINENCE 07/02/2007  . History of spinal cord injury 07/02/2007   Current Meds  Medication Sig  . acyclovir (ZOVIRAX) 400 MG tablet Take 400 mg by mouth.  . busPIRone (BUSPAR) 7.5 MG tablet   . clonazePAM (KLONOPIN) 0.5 MG tablet Take 0.5 mg by mouth. PRN  . docusate sodium (COLACE) 100 MG capsule Take by mouth daily.  . Multiple Vitamin (MULTI-VITAMINS) TABS Take by mouth.  Marland Kitchen omeprazole (PRILOSEC) 20 MG capsule TAKE 1 CAPSULE (20 MG TOTAL) BY MOUTH CONTINUOUS AS NEEDED.  Marland Kitchen oxybutynin (DITROPAN) 5 MG tablet TAKE 1 TABLET BY MOUTH 3  TIMES DAILY  . pantoprazole (PROTONIX) 40 MG tablet Take 1 tablet (40 mg total) by mouth daily.  . sertraline (ZOLOFT) 100 MG tablet Take 100 mg by mouth.  . ziprasidone (GEODON) 20 MG capsule Take 20 mg by  mouth 2 (two) times daily with a meal.  . zolpidem (AMBIEN) 10 MG tablet Take 10 mg by mouth. PRN    Allergies: Patient has No Known Allergies. Family History: Patient family history includes Breast cancer in her paternal aunt; Colon polyps in her mother; Diabetes in her father, maternal grandmother, and mother. Social History:  Patient  reports that she has never smoked. She has never used smokeless tobacco. She reports that she drinks alcohol. She reports that she does not use drugs.  Review of Systems: Constitutional: Negative for fever malaise or anorexia Cardiovascular: negative for chest pain Respiratory: negative for SOB or persistent cough Gastrointestinal: negative for abdominal pain  Objective  Vitals: BP 102/78   Pulse 97   Temp 99 F (37.2 C)   Ht 5\' 9"  (1.753 m)   Wt 130 lb (59 kg)   BMI 19.20 kg/m  General: no acute distress , A&Ox3, in wheel chair HEENT: PEERL, conjunctiva normal, Oropharynx moist,neck is supple, B TMs are normal and EAC is clear. No wax.  Nl hearing screen Cardiovascular:  RRR without murmur or gallop.  Respiratory:  Good breath sounds bilaterally, CTAB with normal respiratory effort Skin:  Warm, no rashes Gastrointestinal: soft, flat abdomen, normal active bowel sounds, no palpable masses, no hepatosplenomegaly, no appreciated hernias   Assessment  1. Tinnitus of both ears   2. Non-intractable vomiting with nausea, unspecified vomiting type  3. Aspiration into airway, initial encounter      Plan   tinnitus:  Mild. No red flag sxs. Reassured no ear wax accumulation/impaction. No significant hearing loss. Monitor. To ENT if worsens. Recent thyroid nl.  N/v  Episode: ? Etiology. Due to patients choking or reported difficulty breathing, counseled on need to consider a life line support monitoring system in case of emergencies. Also discussed use of 911 for emergency services.   Possible aspiration; fortunately resolved w/o sequelae.    Follow up: prn   Commons side effects, risks, benefits, and alternatives for medications and treatment plan prescribed today were discussed, and the patient expressed understanding of the given instructions. Patient is instructed to call or message via MyChart if he/she has any questions or concerns regarding our treatment plan. No barriers to understanding were identified. We discussed Red Flag symptoms and signs in detail. Patient expressed understanding regarding what to do in case of urgent or emergency type symptoms.   Medication list was reconciled, printed and provided to the patient in AVS. Patient instructions and summary information was reviewed with the patient as documented in the AVS. This note was prepared with assistance of Dragon voice recognition software. Occasional wrong-word or sound-a-like substitutions may have occurred due to the inherent limitations of voice recognition software  No orders of the defined types were placed in this encounter.  No orders of the defined types were placed in this encounter.

## 2017-05-29 ENCOUNTER — Encounter: Payer: Self-pay | Admitting: Physical Therapy

## 2017-05-29 ENCOUNTER — Other Ambulatory Visit: Payer: Self-pay
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
## 2017-05-30 NOTE — Telephone Encounter (Signed)
Please schedule office visit to review current issues as last office visit was 4 years ago. Ok to schedule with extender. Thanks

## 2017-05-31 DIAGNOSIS — G8220 Paraplegia, unspecified: Secondary | ICD-10-CM | POA: Diagnosis not present

## 2017-05-31 DIAGNOSIS — N319 Neuromuscular dysfunction of bladder, unspecified: Secondary | ICD-10-CM | POA: Diagnosis not present

## 2017-05-31 DIAGNOSIS — K592 Neurogenic bowel, not elsewhere classified: Secondary | ICD-10-CM | POA: Diagnosis not present

## 2017-05-31 DIAGNOSIS — T300 Burn of unspecified body region, unspecified degree: Secondary | ICD-10-CM | POA: Diagnosis not present

## 2017-05-31 NOTE — Telephone Encounter (Signed)
Are you comfortable calling her to schedule?

## 2017-06-01 NOTE — Telephone Encounter (Signed)
No answer, mail box full . Will try again

## 2017-06-03 DIAGNOSIS — F331 Major depressive disorder, recurrent, moderate: Secondary | ICD-10-CM | POA: Diagnosis not present

## 2017-06-17 DIAGNOSIS — F331 Major depressive disorder, recurrent, moderate: Secondary | ICD-10-CM | POA: Diagnosis not present

## 2017-06-21 ENCOUNTER — Encounter: Payer: Self-pay | Admitting: Family Medicine

## 2017-06-22 ENCOUNTER — Encounter: Payer: Self-pay | Admitting: Family Medicine

## 2017-06-22 ENCOUNTER — Other Ambulatory Visit: Payer: Self-pay | Admitting: Emergency Medicine

## 2017-06-22 ENCOUNTER — Ambulatory Visit (INDEPENDENT_AMBULATORY_CARE_PROVIDER_SITE_OTHER): Payer: Medicare Other | Admitting: Family Medicine

## 2017-06-22 ENCOUNTER — Telehealth: Payer: Self-pay | Admitting: Family Medicine

## 2017-06-22 ENCOUNTER — Other Ambulatory Visit: Payer: Self-pay

## 2017-06-22 VITALS — BP 92/60 | HR 85 | Temp 97.9°F | Ht 69.0 in | Wt 133.0 lb

## 2017-06-22 DIAGNOSIS — E559 Vitamin D deficiency, unspecified: Secondary | ICD-10-CM | POA: Diagnosis not present

## 2017-06-22 DIAGNOSIS — F331 Major depressive disorder, recurrent, moderate: Secondary | ICD-10-CM | POA: Diagnosis not present

## 2017-06-22 DIAGNOSIS — M67912 Unspecified disorder of synovium and tendon, left shoulder: Secondary | ICD-10-CM

## 2017-06-22 DIAGNOSIS — B0089 Other herpesviral infection: Secondary | ICD-10-CM

## 2017-06-22 MED ORDER — ACYCLOVIR 400 MG PO TABS: 400.0000 mg | ORAL_TABLET | Freq: Three times a day (TID) | ORAL | 0 refills | Status: AC

## 2017-06-22 MED ORDER — ACYCLOVIR 5 % EX OINT: 1.0000 "application " | TOPICAL_OINTMENT | Freq: Four times a day (QID) | CUTANEOUS | 0 refills | Status: DC

## 2017-06-22 NOTE — Telephone Encounter (Signed)
Please see CRM and Advise.   Doloris Hall,  LPN

## 2017-06-22 NOTE — Telephone Encounter (Signed)
Can you check to see cost with [facility] discount??  If still expensive, please order acyclovir 400mg  TID x 7 days.
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
## 2017-06-22 NOTE — Telephone Encounter (Signed)
Copied from [place] [id number]. Topic: Quick Communication - Rx Refill/Question >> Jun 22, 2017 12:29 PM Lennox Solders wrote: Medication:cream for her rash .Has the patient contacted their pharmacy yes  [facility]. Pt just saw dr Jonni Sanger and the medication cost 600.00 per pt the pharm said it will need a PA. Pt does not know the name of medication. Pt would like another medication that does not require PA

## 2017-06-22 NOTE — Patient Instructions (Addendum)
Please return in 4-6 weeks to recheck shoulder.  Use the cream on your rash.   Please go to our [facility] office to get your xrays done. You can walk in M-F between 8am and 5pm. Tell them you are there for xrays ordered by me. They will send me the results, then I will let you know the results with instructions.   Address: [place], [place], [place]  (office sits at [address] at [address] intersection; from here, turn left onto [place] [address]), take to [address], turn right and go for a mile or so, office will be on left across form [address] )   If you have any questions or concerns, please don't hesitate to send me a message via MyChart or call the office at [phone]. Thank you for visiting with [place] today! It's our pleasure caring for you.    Rotator Cuff Tendinitis Rotator cuff tendinitis is inflammation of the tough, cord-like bands that connect muscle to bone (tendons) in the rotator cuff. The rotator cuff includes all of the muscles and tendons that connect the arm to the shoulder. The rotator cuff holds the head of the upper arm bone (humerus) in the cup (fossa) of the shoulder blade (scapula). This condition can lead to a long-lasting (chronic) tear. The tear may be partial or complete. What are the causes? This condition is usually caused by overusing the rotator cuff. What increases the risk? This condition is more likely to develop in athletes and workers who frequently use their shoulder or reach over their heads. This can include activities such as:  Tennis.  Baseball or softball.  Swimming.  Construction work.  Painting.  What are the signs or symptoms? Symptoms of this condition include:  Pain spreading (radiating) from the shoulder to the upper arm.  Swelling and tenderness in front of the shoulder.  Pain when reaching, pulling, or lifting the arm above the head.  Pain when  lowering the arm from above the head.  Minor pain in the shoulder when resting.  Increased pain in the shoulder at night.  Difficulty placing the arm behind the back.  How is this diagnosed? This condition is diagnosed with a medical history and physical exam. Tests may also be done, including:  X-rays.  MRI.  Ultrasounds.  CT or MR arthrogram. During this test, a contrast material is injected and then images are taken.  How is this treated? Treatment for this condition depends on the severity of the condition. In less severe cases, treatment may include:  Rest. This may be done with a sling that holds the shoulder still (immobilization). Your health care provider may also recommend avoiding activities that involve lifting your arm over your head.  Icing the shoulder.  Anti-inflammatory medicines, such as aspirin or ibuprofen.  In more severe cases, treatment may include:  Physical therapy.  Steroid injections.  Surgery.  Follow these instructions at home: If you have a sling:  Wear the sling as told by your health care provider. Remove it only as told by your health care provider.  Loosen the sling if your fingers tingle, become numb, or turn cold and blue.  Keep the sling clean.  If the sling is not waterproof, do not let it get wet. Remove it, if allowed, or cover it with a watertight covering when you take a bath or shower. Managing pain, stiffness, and swelling  If directed, put ice on the injured  area. ? If you have a removable sling, remove it as told by your health care provider. ? Put ice in a plastic bag. ? Place a towel between your skin and the bag. ? Leave the ice on for 20 minutes, 2-3 times a day.  Move your fingers often to avoid stiffness and to lessen swelling.  Raise (elevate) the injured area above the level of your heart while you are lying down.  Find a comfortable sleeping position or sleep on a recliner, if available. Driving  Do  not drive or use heavy machinery while taking prescription pain medicine.  Ask your health care provider when it is safe to drive if you have a sling on your arm. Activity  Rest your shoulder as told by your health care provider.  Return to your normal activities as told by your health care provider. Ask your health care provider what activities are safe for you.  Do any exercises or stretches as told by your health care provider.  If you do repetitive overhead tasks, take small breaks in between and include stretching exercises as told by your health care provider. General instructions  Do not use any products that contain nicotine or tobacco, such as cigarettes and e-cigarettes. These can delay healing. If you need help quitting, ask your health care provider.  Take over-the-counter and prescription medicines only as told by your health care provider.  Keep all follow-up visits as told by your health care provider. This is important. Contact a health care provider if:  Your pain gets worse.  You have new pain in your arm, hands, or fingers.  Your pain is not relieved with medicine or does not get better after 6 weeks of treatment.  You have cracking sensations when moving your shoulder in certain directions.  You hear a snapping sound after using your shoulder, followed by severe pain and weakness. Get help right away if:  Your arm, hand, or fingers are numb or tingling.  Your arm, hand, or fingers are swollen or painful or they turn white or blue. Summary  Rotator cuff tendinitis is inflammation of the tough, cord-like bands that connect muscle to bone (tendons) in the rotator cuff.  This condition is usually caused by overusing the rotator cuff, which includes all of the muscles and tendons that connect the arm to the shoulder.  This condition is more likely to develop in athletes and workers who frequently use their shoulder or reach over their heads.  Treatment  generally includes rest, anti-inflammatory medicines, and icing. In some cases, physical therapy and steroid injections may be needed. In severe cases, surgery may be needed. This information is not intended to replace advice given to you by your health care provider. Make sure you discuss any questions you have with your health care provider. Document Released: 05/16/2003 Document Revised: 02/10/2016 Document Reviewed: 02/10/2016 Elsevier Interactive Patient Education  2017 [employer].

## 2017-06-22 NOTE — Telephone Encounter (Signed)
New Prescription sent to Pharmacy.   Doloris Hall,  LPN

## 2017-06-26 ENCOUNTER — Encounter: Payer: Self-pay | Admitting: Family Medicine

## 2017-07-05 ENCOUNTER — Ambulatory Visit (INDEPENDENT_AMBULATORY_CARE_PROVIDER_SITE_OTHER): Payer: Medicare Other | Admitting: Physician Assistant

## 2017-07-05 ENCOUNTER — Telehealth: Payer: Self-pay | Admitting: *Deleted

## 2017-07-05 ENCOUNTER — Encounter: Payer: Self-pay | Admitting: Physician Assistant

## 2017-07-05 VITALS — BP 112/64 | HR 62 | Ht 69.0 in

## 2017-07-05 DIAGNOSIS — R159 Full incontinence of feces: Secondary | ICD-10-CM | POA: Diagnosis not present

## 2017-07-05 DIAGNOSIS — Z1211 Encounter for screening for malignant neoplasm of colon: Secondary | ICD-10-CM | POA: Diagnosis not present

## 2017-07-05 MED ORDER — HYOSCYAMINE SULFATE SL 0.125 MG SL SUBL: SUBLINGUAL_TABLET | SUBLINGUAL | 1 refills | Status: DC

## 2017-07-05 MED ORDER — NA SULFATE-K SULFATE-MG SULF 17.5-3.13-1.6 GM/177ML PO SOLN: 1.0000 | Freq: Once | ORAL | 0 refills | Status: AC

## 2017-07-05 NOTE — Telephone Encounter (Signed)
LM for the patient on number, [phone]. I informed her that I spoke to a scheduler at the hospital and she can inform the anesthesiologist when she arrives the day of the procedure.

## 2017-07-05 NOTE — Patient Instructions (Addendum)
You have been scheduled for a colonoscopy. Please follow written instructions given to you at your visit today.  Please pick up your prep supplies at the pharmacy within the next 1-3 days. [facility].  If you use inhalers (even only as needed), please bring them with you on the day of your procedure.  Take Benefiber 1 dose daily in a glass of water.  Try Align or Culturell probiotic. We have provided samples of Align. You can get these at the pharmacy or [facility], [facility].   We will find out who to talk to about having the procedure unsedated.. Someone will call you about this.

## 2017-07-05 NOTE — Progress Notes (Signed)
Subjective:    Patient ID: Catherine Oneal, female    DOB: 09/01/1960, 57 y.o.   MRN: [id number]  HPI Catherine Oneal is a pleasant 57 year old white female, former patient of Dr. Delfin Edis who comes in today to discuss follow-up colonoscopy.  She would like to be established with Dr. Silverio Decamp. Patient had a remote spinal cord injury with paraplegia, and secondary neurogenic bowel and bladder. She had undergone upper endoscopy in July 2014 per Dr. Olevia Perches with finding of a normal esophagus and mild gastritis. Colonoscopy in July 2008 was normal, did note decreased sphincter tone. Patient has no family history of colon cancer or polyps. She relates that she has been having some problems with her bowels over the past couple of years.  She states that she has been told that neurogenic bowel can worsen with time.  She usually does a bowel regimen every other day with either digital stimulation or tap water enema.  She has been having an episode about once a week of incontinence of semi-formed stool.  She is also tried fiber supplements recently with Metamucil but says that caused a lot of bloating so she did not continue. She has had problems long-term with intermittent incontinence which seem to occur much more frequently if she had been out to eat.  She is not aware of any specific food intolerances but says it seemed to occur so often that she became very reluctant to go out to eat. It had also been suggested to her by her physician at [name] to do an enema or rectal stimulation on a daily basis rather than every couple of days.  Review of Systems;Pertinent positive and negative review of systems were noted in the above HPI section.  All other review of systems was otherwise negative.  Outpatient Encounter Medications as of 07/05/2017  Medication Sig  . acyclovir (ZOVIRAX) 400 MG tablet Take 400 mg by mouth.  Marland Kitchen acyclovir ointment (ZOVIRAX) 5 % Apply 1 application topically 4 (four) times daily.  .  busPIRone (BUSPAR) 7.5 MG tablet   . clonazePAM (KLONOPIN) 0.5 MG tablet Take 0.5 mg by mouth. PRN  . docusate sodium (COLACE) 100 MG capsule Take by mouth daily.  . Multiple Vitamin (MULTI-VITAMINS) TABS Take by mouth.  Marland Kitchen omeprazole (PRILOSEC) 20 MG capsule TAKE 1 CAPSULE (20 MG TOTAL) BY MOUTH CONTINUOUS AS NEEDED.  Marland Kitchen oxybutynin (DITROPAN) 5 MG tablet TAKE 1 TABLET BY MOUTH 3  TIMES DAILY  . pantoprazole (PROTONIX) 40 MG tablet Take 1 tablet (40 mg total) by mouth daily.  . sertraline (ZOLOFT) 100 MG tablet Take 100 mg by mouth.  . ziprasidone (GEODON) 20 MG capsule Take 20 mg by mouth 2 (two) times daily with a meal.  . zolpidem (AMBIEN) 10 MG tablet Take 10 mg by mouth. PRN  . Hyoscyamine Sulfate SL (LEVSIN/SL) 0.125 MG SUBL Dissolve one tablet under the tongue as neede for bowel spasms.  Try 1 prior to going out to eat.  . Na Sulfate-K Sulfate-Mg Sulf 17.5-3.13-1.6 GM/177ML SOLN Take 1 kit by mouth once for 1 dose.   No facility-administered encounter medications on file as of 07/05/2017.    No Known Allergies Patient Active Problem List   Diagnosis Date Noted  . Neurogenic bowel 11/08/2013  . Anxiety 05/21/2013  . Urinary incontinence 05/09/2013  . Neurogenic bladder 11/14/2012  . Vitamin D deficiency 10/29/2011  . Osteoporosis 10/22/2011  . Episodic mood disorder ([place]) 07/02/2007  . PARAPLEGIA 07/02/2007  . RECTAL INCONTINENCE 07/02/2007  .  History of spinal cord injury 07/02/2007   Social History   Socioeconomic History  . Marital status: Single    Spouse name: Not on file  . Number of children: 0  . Years of education: Not on file  . Highest education level: Not on file  Occupational History  . Occupation: Disabled  . Occupation: Scientist, research (medical), Freight forwarder  Social Needs  . Financial resource strain: Not on file  . Food insecurity:    Worry: Not on file    Inability: Not on file  . Transportation needs:    Medical: Not on file    Non-medical: Not on file  Tobacco  Use  . Smoking status: Never Smoker  . Smokeless tobacco: Never Used  Substance and Sexual Activity  . Alcohol use: Yes    Comment: occasional  . Drug use: No  . Sexual activity: Never  Lifestyle  . Physical activity:    Days per week: Not on file    Minutes per session: Not on file  . Stress: Not on file  Relationships  . Social connections:    Talks on phone: Not on file    Gets together: Not on file    Attends religious service: Not on file    Active member of club or organization: Not on file    Attends meetings of clubs or organizations: Not on file    Relationship status: Not on file  . Intimate partner violence:    Fear of current or ex partner: Not on file    Emotionally abused: Not on file    Physically abused: Not on file    Forced sexual activity: Not on file  Other Topics Concern  . Not on file  Social History Narrative  . Not on file    Ms. Suto's family history includes Breast cancer in her paternal aunt; Colon polyps in her mother; Diabetes in her father, maternal grandmother, and mother.      Objective:    Vitals:   07/05/17 1349  BP: 112/64  Pulse: 62    Physical Exam; WD WF in NAD, she is wheelchair bound with hx of paraplegia she is very pleasant, blood pressure 112/64, pulse 62, height 5 foot 9, BMI 19.6.  HEENT; nontraumatic normocephalic EOMI PERRLA sclera anicteric, Cardiovascular; regular rate and rhythm with S1-S2 no murmur rub or gallop, Pulmonary; clear bilaterally, Abdomen; soft, nondistended bowel sounds are present there is no palpable mass or hepatosplenomegaly.  Rectal; exam not done, Extremities ;no clubbing cyanosis or edema there is muscular wasting, patient is paraplegic, Neuro psych; mood and affect appropriate         Assessment & Plan:   #59 57 year old white female paraplegic, due for follow-up colon cancer screening with normal colonoscopy July 2008 #2 long-standing neurogenic bowel with some recent increase in  sporadic episodes of incontinence of looser stool. #3 anxiety/mood disorder #4 neurogenic bladder  Plan; patient will be scheduled for colonoscopy with Dr. Silverio Decamp.  Procedure was discussed in detail with patient including indications risks and benefits and she is agreeable to proceed. She does not feel she will have any difficulty completing a prep at home.  Procedure will be scheduled at [facility]. Advised patient to try Benefiber 1 dose daily and a glass of water or other liquid, and agree that daily bowel program with either digital stimulation or tap water enema if needed will likely both help with constipation and decrease episodes of incontinence if her lower bowel is cleared each day. She  asks about probiotics.  Suggested she may want to try align once daily or Culturelle once daily. We will also give her a prescription for Levsin sublingual to use on a as needed basis prior to going out to eat.   Amy S Esterwood PA-C 07/05/2017   Cc: Leamon Arnt, MD

## 2017-07-05 NOTE — Telephone Encounter (Signed)
Continuing note for 07-05-2017.  The patient had requested someone call her to let her know if she can have the option to have no sedation for the procedure.  She is a paraplegic and has no feeling from the waste down.  She can discuss this with the staff when she arrives for the procedure on 09-28-2017 at [facility].

## 2017-07-08 DIAGNOSIS — F331 Major depressive disorder, recurrent, moderate: Secondary | ICD-10-CM | POA: Diagnosis not present

## 2017-07-09 NOTE — Progress Notes (Signed)
Reviewed and agree with documentation and assessment and plan. K. Veena Ernesha Ramone , MD   

## 2017-07-15 ENCOUNTER — Other Ambulatory Visit: Payer: Self-pay | Admitting: Family Medicine

## 2017-07-15 NOTE — Telephone Encounter (Signed)
Copied from [place] [phone]. Topic: Quick Communication - See Telephone Encounter >> Jul 15, 2017  9:57 AM Marja Kays F wrote: Pt is needing to get a refill onoxybutynin    [phone]  Pt is out of town   [place] number [phone]

## 2017-07-16 MED ORDER — OXYBUTYNIN CHLORIDE 5 MG PO TABS: 5.0000 mg | ORAL_TABLET | Freq: Three times a day (TID) | ORAL | 0 refills | Status: DC

## 2017-07-16 NOTE — Telephone Encounter (Signed)
Ditropan refill request  LOV 06/22/17 with Dr. Jonni Sanger  Last refill is 01/14/16  #270.    Pt is out of town.   [facility] as [phone].  Pt  Call back number is [phone]

## 2017-07-19 ENCOUNTER — Ambulatory Visit: Payer: Medicare Other | Admitting: Family Medicine

## 2017-07-20 DIAGNOSIS — F331 Major depressive disorder, recurrent, moderate: Secondary | ICD-10-CM | POA: Diagnosis not present

## 2017-07-22 ENCOUNTER — Ambulatory Visit: Payer: Medicare Other | Admitting: Family Medicine

## 2017-07-23 ENCOUNTER — Telehealth: Payer: Self-pay | Admitting: Family Medicine

## 2017-07-23 ENCOUNTER — Other Ambulatory Visit: Payer: Self-pay | Admitting: Family Medicine

## 2017-07-23 ENCOUNTER — Ambulatory Visit (INDEPENDENT_AMBULATORY_CARE_PROVIDER_SITE_OTHER): Payer: Medicare Other | Admitting: Family Medicine

## 2017-07-23 ENCOUNTER — Encounter: Payer: Self-pay | Admitting: Family Medicine

## 2017-07-23 ENCOUNTER — Other Ambulatory Visit: Payer: Self-pay

## 2017-07-23 ENCOUNTER — Ambulatory Visit (INDEPENDENT_AMBULATORY_CARE_PROVIDER_SITE_OTHER): Payer: Medicare Other

## 2017-07-23 VITALS — BP 110/76 | HR 88 | Temp 98.2°F | Ht 69.0 in | Wt 135.0 lb

## 2017-07-23 DIAGNOSIS — M25512 Pain in left shoulder: Secondary | ICD-10-CM | POA: Diagnosis not present

## 2017-07-23 DIAGNOSIS — S4992XA Unspecified injury of left shoulder and upper arm, initial encounter: Secondary | ICD-10-CM | POA: Diagnosis not present

## 2017-07-23 DIAGNOSIS — F39 Unspecified mood [affective] disorder: Secondary | ICD-10-CM

## 2017-07-23 DIAGNOSIS — M67912 Unspecified disorder of synovium and tendon, left shoulder: Secondary | ICD-10-CM

## 2017-07-23 DIAGNOSIS — Z1239 Encounter for other screening for malignant neoplasm of breast: Secondary | ICD-10-CM

## 2017-07-23 DIAGNOSIS — Z1231 Encounter for screening mammogram for malignant neoplasm of breast: Secondary | ICD-10-CM

## 2017-07-23 DIAGNOSIS — B0089 Other herpesviral infection: Secondary | ICD-10-CM

## 2017-07-23 MED ORDER — OXYBUTYNIN CHLORIDE 5 MG PO TABS: 5.0000 mg | ORAL_TABLET | Freq: Three times a day (TID) | ORAL | 3 refills | Status: DC

## 2017-07-23 NOTE — Telephone Encounter (Signed)
Ordered updated.   Doloris Hall,  LPN

## 2017-07-23 NOTE — Progress Notes (Signed)
Subjective  CC:  Chief Complaint  Patient presents with  . Shoulder Pain    states shoulder is felling better, doing excercises    HPI: Catherine Oneal is a 57 y.o. female who presents to the office today to address the problems listed above in the chief complaint.  Left shoulder pain: MUCH improved. Responded very well to steroid injection. Now pain free. Has been doing rom exercises and this has improved as well. No limitations functionally now. Had xrays today; report pending.   Rash: responded to valtrex. No more pain but does have a scar.  HM: due for colonoscopy: has scheduled. Due mammogram  Neurogenic bowel: has seen GI. Working with adjusting/balancing meds.    Mood: doing much better; anxiety has lessened. Being more active and making some relationships in community.  I reviewed the patients updated PMH, FH, and SocHx.    Patient Active Problem List   Diagnosis Date Noted  . Neurogenic bowel 11/08/2013  . Anxiety 05/21/2013  . Urinary incontinence 05/09/2013  . Neurogenic bladder 11/14/2012  . Vitamin D deficiency 10/29/2011  . Osteoporosis 10/22/2011  . Episodic mood disorder ([place]) 07/02/2007  . PARAPLEGIA 07/02/2007  . RECTAL INCONTINENCE 07/02/2007  . History of spinal cord injury 07/02/2007   Current Meds  Medication Sig  . busPIRone (BUSPAR) 7.5 MG tablet   . clonazePAM (KLONOPIN) 0.5 MG tablet Take 0.5 mg by mouth. PRN  . docusate sodium (COLACE) 100 MG capsule Take by mouth daily.  . Multiple Vitamin (MULTI-VITAMINS) TABS Take by mouth.  Marland Kitchen omeprazole (PRILOSEC) 20 MG capsule TAKE 1 CAPSULE (20 MG TOTAL) BY MOUTH CONTINUOUS AS NEEDED.  Marland Kitchen oxybutynin (DITROPAN) 5 MG tablet Take 1 tablet (5 mg total) by mouth 3 (three) times daily.  . sertraline (ZOLOFT) 100 MG tablet Take 100 mg by mouth.  . ziprasidone (GEODON) 20 MG capsule Take 20 mg by mouth 2 (two) times daily with a meal.  . [DISCONTINUED] oxybutynin (DITROPAN) 5 MG tablet Take 1 tablet (5 mg  total) by mouth 3 (three) times daily.    Allergies: Patient has No Known Allergies. Family History: Patient family history includes Breast cancer in her paternal aunt; Colon polyps in her mother; Diabetes in her father, maternal grandmother, and mother. Social History:  Patient  reports that she has never smoked. She has never used smokeless tobacco. She reports that she drinks alcohol. She reports that she does not use drugs.  Review of Systems: Constitutional: Negative for fever malaise or anorexia Cardiovascular: negative for chest pain Respiratory: negative for SOB or persistent cough Gastrointestinal: negative for abdominal pain  Objective  Vitals: BP 110/76   Pulse 88   Temp 98.2 F (36.8 C)   Ht 5\' 9"  (1.753 m)   Wt 135 lb (61.2 kg)   BMI 19.94 kg/m  General: no acute distress , A&Ox3, looks happy and well Left shoulder: AROM to 120 now; almost full PROM, no tenderness.  Skin: scar in area of recent rash. No vesicles or ttp   Assessment  1. Tendinopathy of left rotator cuff   2. Breast cancer screening   3. Herpetic dermatitis   4. Episodic mood disorder (HCC)      Plan   RCT:  Improved. Now send for PT to work on ROM.   Set up mammogram  HD vs shingles: resolved. Discussed possibility of recurrence. F/u here if happens.   Mood: much better. Continue meds.   Neurogenic bladder: ok to use colace as needed.  Follow up: Return in 6 months (on 01/23/2018) for recheck mood/anxiety.    Commons side effects, risks, benefits, and alternatives for medications and treatment plan prescribed today were discussed, and the patient expressed understanding of the given instructions. Patient is instructed to call or message via MyChart if he/she has any questions or concerns regarding our treatment plan. No barriers to understanding were identified. We discussed Red Flag symptoms and signs in detail. Patient expressed understanding regarding what to do in case of urgent or  emergency type symptoms.   Medication list was reconciled, printed and provided to the patient in AVS. Patient instructions and summary information was reviewed with the patient as documented in the AVS. This note was prepared with assistance of Dragon voice recognition software. Occasional wrong-word or sound-a-like substitutions may have occurred due to the inherent limitations of voice recognition software  Orders Placed This Encounter  Procedures  . MM Digital Screening  . Ambulatory referral to Physical Therapy   Meds ordered this encounter  Medications  . oxybutynin (DITROPAN) 5 MG tablet    Sig: Take 1 tablet (5 mg total) by mouth 3 (three) times daily.    Dispense:  270 tablet    Refill:  3    90 day supply

## 2017-07-23 NOTE — Telephone Encounter (Signed)
[facility] [employer] called, asking for a second order for the Left Shoulder Xray. Original order expired yesterday (5/16) and a new one is needed.

## 2017-07-23 NOTE — Telephone Encounter (Signed)
Copied from [place] [phone]. Topic: Inquiry >> Jul 23, 2017 12:29 PM Arletha Grippe wrote: Reason for CRM: pt is asking if her order for xray can be put back in - the order has expired before she could go to [facility] to get the xray done.  Pt is also wanting to know if she can not get the xray done before her appt this afternoon, if she should cancel it? Please call [phone]

## 2017-07-23 NOTE — Patient Instructions (Addendum)
Follow up with me in 6 Months to recheck mood  We will call you with information regarding your referral appointment for physical therapy at [facility]. We will also call you for your mammogram with [facility].

## 2017-07-23 NOTE — Telephone Encounter (Signed)
Dr. Jonni Sanger has placed the order.  Per Dr. Jonni Sanger, Tech from [facility] is calling patient to let her know.

## 2017-08-04 ENCOUNTER — Encounter: Payer: Self-pay | Admitting: Physical Therapy

## 2017-08-04 ENCOUNTER — Ambulatory Visit (INDEPENDENT_AMBULATORY_CARE_PROVIDER_SITE_OTHER): Payer: Medicare Other | Admitting: Physical Therapy

## 2017-08-04 DIAGNOSIS — M25612 Stiffness of left shoulder, not elsewhere classified: Secondary | ICD-10-CM

## 2017-08-04 DIAGNOSIS — M25512 Pain in left shoulder: Secondary | ICD-10-CM

## 2017-08-04 DIAGNOSIS — F419 Anxiety disorder, unspecified: Secondary | ICD-10-CM | POA: Diagnosis not present

## 2017-08-04 DIAGNOSIS — G8929 Other chronic pain: Secondary | ICD-10-CM

## 2017-08-04 NOTE — Patient Instructions (Signed)
Access Code: [email or web address]  URL: [email or web address]  Date: 08/04/2017  Prepared by: Lyndee Hensen   Exercises  Supine Shoulder Flexion Extension AAROM with Dowel - 10 reps - 2 sets - 2x daily  Supine Scapular Protraction in Flexion with Dumbbells - 10 reps - 2 sets - 2x daily  Supine Chest Stretch with Elbows Bent - 10 reps - 2 sets - 5 hold - 2x daily  Supine Shoulder External Rotation in 45 Degrees Abduction AAROM with Dowel - 10 reps - 2 sets - 2x daily

## 2017-08-05 ENCOUNTER — Encounter: Payer: Self-pay | Admitting: Physical Therapy

## 2017-08-05 DIAGNOSIS — F331 Major depressive disorder, recurrent, moderate: Secondary | ICD-10-CM | POA: Diagnosis not present

## 2017-08-05 NOTE — Therapy (Signed)
[facility] [address] [place], [place], [address] Phone: [phone]   Fax:  [phone]  Physical Therapy Evaluation  Patient Details  Name: Catherine Oneal MRN: [id number] Date of Birth: 09-Jan-1961 Referring Provider: Billey Chang   Encounter Date: 08/04/2017  PT End of Session - 08/04/17 1628    Visit Number  1    Number of Visits  12    Date for PT Re-Evaluation  09/15/17    PT Start Time  1520    PT Stop Time  1607    PT Time Calculation (min)  47 min    Activity Tolerance  Patient tolerated treatment well    Behavior During Therapy  [facility] for tasks assessed/performed       Past Medical History:  Diagnosis Date  . Anxiety   . Helicobacter pylori (H. pylori) infection   . Paraplegia ([place])   . Rectal incontinence   . Spinal injury     Past Surgical History:  Procedure Laterality Date  . BACK SURGERY    . BREATH TEK H PYLORI N/A 12/09/2012   Procedure: BREATH TEK H PYLORI;  Surgeon: Lafayette Dragon, MD;  Location: [facility];  Service: Endoscopy;  Laterality: N/A;  . ESOPHAGOGASTRODUODENOSCOPY N/A 10/06/2012   Procedure: ESOPHAGOGASTRODUODENOSCOPY (EGD);  Surgeon: Lafayette Dragon, MD;  Location: [facility];  Service: Endoscopy;  Laterality: N/A;  patient is paraplegic  . UPPER GASTROINTESTINAL ENDOSCOPY      There were no vitals filed for this visit.   Subjective Assessment - 08/04/17 1532    Subjective  Pt states increased pain for about 1 year. She had recent shoulder injection that improved pain. She still feels that shoulder is tight and does not have full ROM. She uses wheelchair primarily (paraplegia) and states increased pain several months ago when playing wheelchair basketball.     Limitations  Lifting;House hold activities    Currently in Pain?  Yes    Pain Score  3     Pain Location  Shoulder    Pain Orientation  Left    Pain Descriptors / Indicators  Aching    Pain Type  Chronic pain    Pain Onset   More than a month ago    Pain Frequency  Intermittent    Aggravating Factors   increased use of L UE, overhead motions, reaching, lifting.          [facility] PT Assessment - 08/05/17 0001      Assessment   Medical Diagnosis  L shoulder Pain    Referring Provider  Billey Chang    Hand Dominance  Right    Prior Therapy  not for shoulder      Balance Screen   Has the patient fallen in the past 6 months  No      Prior Function   Level of Independence  Independent      Cognition   Overall Cognitive Status  Within Functional Limits for tasks assessed      Sensation   Additional Comments  Paraplegia, no sensation from waist down      Posture/Postural Control   Posture Comments  Pt sitting in manual wheelchair, in posterior pelvic tilt, mild R pelvic/trunk  tilt       ROM / Strength   AROM / PROM / Strength  AROM;Strength      AROM   AROM Assessment Site  Shoulder    Right/Left Shoulder  Left    Right Shoulder Flexion  --  Right Shoulder ABduction  --    Right Shoulder Internal Rotation  --    Right Shoulder External Rotation  --    Left Shoulder Flexion  145 Degrees    Left Shoulder ABduction  140 Degrees    Left Shoulder Internal Rotation  65 Degrees    Left Shoulder External Rotation  70 Degrees      Strength   Strength Assessment Site  Shoulder    Right/Left Shoulder  Left    Right Shoulder Flexion  --    Right Shoulder ABduction  --    Right Shoulder Internal Rotation  --    Right Shoulder External Rotation  --    Left Shoulder Flexion  4/5 within available range    Left Shoulder ABduction  4/5    Left Shoulder Internal Rotation  4+/5    Left Shoulder External Rotation  4/5      Palpation   Palpation comment  GHJ stiffness/hypomobility for flexion; no tenderness to palpate L shoulder                 Objective measurements completed on examination: See above findings.      [facility] Adult PT Treatment/Exercise - 08/05/17 0001      Exercises    Exercises  Shoulder      Shoulder Exercises: Supine   External Rotation  20 reps    External Rotation Weight (lbs)  2    External Rotation Limitations  IR/ER    Flexion  AAROM;15 reps;Limitations    Flexion Limitations  cane    Other Supine Exercises  ER butterfly stretch;     Other Supine Exercises  SA punch 2 lb      Manual Therapy   Manual Therapy  Joint mobilization;Passive ROM    Passive ROM  PROM for L shoulder all motions              PT Education - 08/04/17 1627    Education provided  Yes    Education Details  HEP, PT POC     Person(s) Educated  Patient    Methods  Explanation;Demonstration;Handout    Comprehension  Verbalized understanding;Need further instruction       PT Short Term Goals - 08/05/17 1004      PT SHORT TERM GOAL #1   Title  Pt to be independent with initial HEP    Time  3    Period  Weeks    Status  New    Target Date  08/26/17        PT Long Term Goals - 08/05/17 1005      PT LONG TERM GOAL #1   Title  Pt to demo improved shoulder PROM and AROM to be WNL (equal to R side) to improve ability for reaching, lifting, and wheelchair mobility.     Time  6    Period  Weeks    Status  New    Target Date  09/15/17      PT LONG TERM GOAL #2   Title  Pt to demo increased strength of L shoulder and scapular muscles, to at least 4+/5 to improve ability for transfers and wheelchair mobility     Time  6    Period  Weeks    Status  New    Target Date  09/15/17      PT LONG TERM GOAL #3   Title  Pt to demo ability for pivot transfer with proper shoulder mechanics, to improve  pain and over use     Time  6    Period  Weeks    Status  New    Target Date  09/15/17             Plan - 08/04/17 1628    Clinical Impression Statement  Pt presents with primary complaint of increased pain in L shoulder, consistent with over-use injury/ tendonitis. She has increased joint stiffness in GHJ, and decreased ability for PROM and AROM at end range,  most limited in flexion. She has mild decrease in shouder strength for elevation, and decreased strength of scapular muscles. Pt to benefit from education on shoulder mechanics with transfers to/from wheelchair. Pt also has questions about her new wheelchair, and lumbar/pelvic positioning in wheelchair. Pt may require referral to [facility] PT for evaluation of this after d/c from PT for shoulder. Pt to benefit from skilled PT to improve deficits and improve ability for functional reaching , lifting, carrying with L UE.     Clinical Presentation  Stable    Clinical Decision Making  Low    Rehab Potential  Good    PT Frequency  2x / week    PT Duration  6 weeks    PT Treatment/Interventions  ADLs/Self Care Home Management;Cryotherapy;Electrical Stimulation;Iontophoresis 4mg /ml Dexamethasone;Moist Heat;Therapeutic activities;Functional mobility training;DME Instruction;Ultrasound;Therapeutic exercise;Neuromuscular re-education;Patient/family education;Dry needling;Passive range of motion;Manual techniques;Wheelchair mobility training;Taping;Vasopneumatic Device    PT Next Visit Plan  Scapular stabilization, Rotator cuff strengthening.     PT Home Exercise Plan  Add RTC and scapular strengthening for HEP     Consulted and Agree with Plan of Care  Patient       Patient will benefit from skilled therapeutic intervention in order to improve the following deficits and impairments:  Decreased activity tolerance, Decreased strength, Impaired UE functional use, Pain, Decreased range of motion  Visit Diagnosis: Chronic left shoulder pain  Stiffness of left shoulder, not elsewhere classified     Problem List Patient Active Problem List   Diagnosis Date Noted  . Neurogenic bowel 11/08/2013  . Anxiety 05/21/2013  . Urinary incontinence 05/09/2013  . Neurogenic bladder 11/14/2012  . Vitamin D deficiency 10/29/2011  . Osteoporosis 10/22/2011  . Episodic mood disorder ([place]) 07/02/2007  . PARAPLEGIA  07/02/2007  . RECTAL INCONTINENCE 07/02/2007  . History of spinal cord injury 07/02/2007    Lyndee Hensen, PT, DPT 10:16 AM  08/05/17    [facility] [place], [place], [address] Phone: [phone]   Fax:  [phone]  Name: Catherine Oneal MRN: [id number] Date of Birth: November 23, 1960

## 2017-08-12 ENCOUNTER — Ambulatory Visit (INDEPENDENT_AMBULATORY_CARE_PROVIDER_SITE_OTHER): Payer: Medicare Other | Admitting: Physical Therapy

## 2017-08-12 ENCOUNTER — Encounter: Payer: Self-pay | Admitting: Physical Therapy

## 2017-08-12 DIAGNOSIS — G8929 Other chronic pain: Secondary | ICD-10-CM

## 2017-08-12 DIAGNOSIS — M25512 Pain in left shoulder: Secondary | ICD-10-CM | POA: Diagnosis not present

## 2017-08-12 DIAGNOSIS — M25612 Stiffness of left shoulder, not elsewhere classified: Secondary | ICD-10-CM

## 2017-08-12 NOTE — Therapy (Signed)
[facility] [address] [place], [place], [address] Phone: [phone]   Fax:  [phone]  Physical Therapy Treatment  Patient Details  Name: Catherine Oneal MRN: [id number] Date of Birth: Feb 01, 1961 Referring Provider: Billey Chang   Encounter Date: 08/12/2017  PT End of Session - 08/12/17 2124    Visit Number  2    Number of Visits  12    Date for PT Re-Evaluation  09/15/17    PT Start Time  1308    PT Stop Time  1350    PT Time Calculation (min)  42 min    Activity Tolerance  Patient tolerated treatment well    Behavior During Therapy  [facility] for tasks assessed/performed       Past Medical History:  Diagnosis Date  . Anxiety   . Helicobacter pylori (H. pylori) infection   . Paraplegia ([place])   . Rectal incontinence   . Spinal injury     Past Surgical History:  Procedure Laterality Date  . BACK SURGERY    . BREATH TEK H PYLORI N/A 12/09/2012   Procedure: BREATH TEK H PYLORI;  Surgeon: Lafayette Dragon, MD;  [facility];  Service: Endoscopy;  Laterality: N/A;  . ESOPHAGOGASTRODUODENOSCOPY N/A 10/06/2012   Procedure: ESOPHAGOGASTRODUODENOSCOPY (EGD);  Surgeon: Lafayette Dragon, MD;  Location: [facility];  Service: Endoscopy;  Laterality: N/A;  patient is paraplegic  . UPPER GASTROINTESTINAL ENDOSCOPY      There were no vitals filed for this visit.  Subjective Assessment - 08/12/17 2123    Subjective  Pt states that she has been doing HEP. Pain is improving.     Currently in Pain?  Yes    Pain Score  2     Pain Location  Shoulder    Pain Orientation  Left    Pain Descriptors / Indicators  Aching    Pain Type  Chronic pain    Pain Onset  More than a month ago    Pain Frequency  Intermittent                       [facility] Adult PT Treatment/Exercise - 08/12/17 1319      Posture/Postural Control   Posture Comments  --      Exercises   Exercises  Shoulder      Shoulder Exercises: Supine    External Rotation  --    External Rotation Weight (lbs)  --    External Rotation Limitations  --    Flexion  AAROM;Limitations;20 reps    Flexion Limitations  cane    Other Supine Exercises  ER butterfly stretch;     Other Supine Exercises  --      Shoulder Exercises: Seated   External Rotation  20 reps;Theraband    Theraband Level (Shoulder External Rotation)  Level 3 (Green)    Internal Rotation  20 reps;Theraband    Theraband Level (Shoulder Internal Rotation)  Level 3 (Green)    Other Seated Exercises  Lat Isometric/ Mat press downs 10 sec x4      Shoulder Exercises: Pulleys   Flexion  3 minutes      Manual Therapy   Manual Therapy  Joint mobilization;Passive ROM    Passive ROM  PROM for L shoulder all motions                PT Short Term Goals - 08/05/17 1004      PT SHORT TERM GOAL #  1   Title  Pt to be independent with initial HEP    Time  3    Period  Weeks    Status  New    Target Date  08/26/17        PT Long Term Goals - 08/05/17 1005      PT LONG TERM GOAL #1   Title  Pt to demo improved shoulder PROM and AROM to be WNL (equal to R side) to improve ability for reaching, lifting, and wheelchair mobility.     Time  6    Period  Weeks    Status  New    Target Date  09/15/17      PT LONG TERM GOAL #2   Title  Pt to demo increased strength of L shoulder and scapular muscles, to at least 4+/5 to improve ability for transfers and wheelchair mobility     Time  6    Period  Weeks    Status  New    Target Date  09/15/17      PT LONG TERM GOAL #3   Title  Pt to demo ability for pivot transfer with proper shoulder mechanics, to improve pain and over use     Time  6    Period  Weeks    Status  New    Target Date  09/15/17            Plan - 08/12/17 2128    Clinical Impression Statement  Pt with improving PROM and AROM for L shoulder, flexion up to 155 deg today for PROM in supine. Ther ex progressed with light strengthening. Pt with no  increased pain with activities today. Plan to progress as tolerated.     Rehab Potential  Good    PT Frequency  2x / week    PT Duration  6 weeks    PT Treatment/Interventions  ADLs/Self Care Home Management;Cryotherapy;Electrical Stimulation;Iontophoresis 4mg /ml Dexamethasone;Moist Heat;Therapeutic activities;Functional mobility training;DME Instruction;Ultrasound;Therapeutic exercise;Neuromuscular re-education;Patient/family education;Dry needling;Passive range of motion;Manual techniques;Wheelchair mobility training;Taping;Vasopneumatic Device    PT Next Visit Plan  Scapular stabilization, Rotator cuff strengthening.     PT Home Exercise Plan  Add RTC and scapular strengthening for HEP     Consulted and Agree with Plan of Care  Patient       Patient will benefit from skilled therapeutic intervention in order to improve the following deficits and impairments:  Decreased activity tolerance, Decreased strength, Impaired UE functional use, Pain, Decreased range of motion  Visit Diagnosis: Chronic left shoulder pain  Stiffness of left shoulder, not elsewhere classified     Problem List Patient Active Problem List   Diagnosis Date Noted  . Neurogenic bowel 11/08/2013  . Anxiety 05/21/2013  . Urinary incontinence 05/09/2013  . Neurogenic bladder 11/14/2012  . Vitamin D deficiency 10/29/2011  . Osteoporosis 10/22/2011  . Episodic mood disorder ([place]) 07/02/2007  . PARAPLEGIA 07/02/2007  . RECTAL INCONTINENCE 07/02/2007  . History of spinal cord injury 07/02/2007    Lyndee Hensen, PT, DPT 9:38 PM  08/12/17    [facility] [place], [place], [address] Phone: [phone]   Fax:  [phone]  Name: Catherine Oneal MRN: [id number] Date of Birth: [birth year]

## 2017-08-16 DIAGNOSIS — F419 Anxiety disorder, unspecified: Secondary | ICD-10-CM | POA: Diagnosis not present

## 2017-08-16 DIAGNOSIS — F411 Generalized anxiety disorder: Secondary | ICD-10-CM | POA: Diagnosis not present

## 2017-08-18 ENCOUNTER — Telehealth: Payer: Self-pay | Admitting: Family Medicine

## 2017-08-18 NOTE — Telephone Encounter (Signed)
Patient advised. No further action required.

## 2017-08-18 NOTE — Telephone Encounter (Signed)
The patient requests to know if she should add an appointment for Physical therapy because she cancelled her appointment tomorrow 08/19/17.

## 2017-08-22 ENCOUNTER — Encounter: Payer: Self-pay | Admitting: Family Medicine

## 2017-08-23 DIAGNOSIS — Z1231 Encounter for screening mammogram for malignant neoplasm of breast: Secondary | ICD-10-CM | POA: Diagnosis not present

## 2017-08-23 LAB — HM MAMMOGRAPHY: HM MAMMO: ABNORMAL — AB (ref 0–4)

## 2017-08-24 ENCOUNTER — Encounter: Payer: Self-pay | Admitting: Emergency Medicine

## 2017-08-26 ENCOUNTER — Ambulatory Visit (INDEPENDENT_AMBULATORY_CARE_PROVIDER_SITE_OTHER): Payer: Medicare Other | Admitting: Physical Therapy

## 2017-08-26 ENCOUNTER — Encounter: Payer: Self-pay | Admitting: Physical Therapy

## 2017-08-26 ENCOUNTER — Encounter: Payer: Self-pay | Admitting: Family Medicine

## 2017-08-26 ENCOUNTER — Ambulatory Visit: Payer: Medicare Other | Admitting: Family Medicine

## 2017-08-26 DIAGNOSIS — G8929 Other chronic pain: Secondary | ICD-10-CM

## 2017-08-26 DIAGNOSIS — M25612 Stiffness of left shoulder, not elsewhere classified: Secondary | ICD-10-CM

## 2017-08-26 DIAGNOSIS — M25512 Pain in left shoulder: Secondary | ICD-10-CM

## 2017-08-26 DIAGNOSIS — R928 Other abnormal and inconclusive findings on diagnostic imaging of breast: Secondary | ICD-10-CM | POA: Diagnosis not present

## 2017-08-26 DIAGNOSIS — R921 Mammographic calcification found on diagnostic imaging of breast: Secondary | ICD-10-CM | POA: Diagnosis not present

## 2017-08-26 LAB — HM MAMMOGRAPHY

## 2017-08-26 NOTE — Therapy (Addendum)
[facility] [address] [place], [place], [address] Phone: [phone]   Fax:  [phone]  Physical Therapy Treatment/Discharge  Patient Details  Name: Catherine Oneal MRN: [id number] Date of Birth: 1960-12-31 Referring Provider: Billey Chang   Encounter Date: 08/26/2017  PT End of Session - 08/26/17 1513    Visit Number  3    Number of Visits  12    Date for PT Re-Evaluation  09/15/17    PT Start Time  1093    PT Stop Time  1350    PT Time Calculation (min)  42 min    Activity Tolerance  Patient tolerated treatment well    Behavior During Therapy  [facility] for tasks assessed/performed       Past Medical History:  Diagnosis Date  . Anxiety   . Helicobacter pylori (H. pylori) infection   . Paraplegia ([place])   . Rectal incontinence   . Spinal injury     Past Surgical History:  Procedure Laterality Date  . BACK SURGERY    . BREATH TEK H PYLORI N/A 12/09/2012   Procedure: BREATH TEK H PYLORI;  Surgeon: Lafayette Dragon, MD;  Location: [facility];  Service: Endoscopy;  Laterality: N/A;  . ESOPHAGOGASTRODUODENOSCOPY N/A 10/06/2012   Procedure: ESOPHAGOGASTRODUODENOSCOPY (EGD);  Surgeon: Lafayette Dragon, MD;  Location: [facility];  Service: Endoscopy;  Laterality: N/A;  patient is paraplegic  . UPPER GASTROINTESTINAL ENDOSCOPY      There were no vitals filed for this visit.  Subjective Assessment - 08/26/17 1311    Subjective  Pt states slight soreness in shoulder in the last couple days. She notes improved pain last week.     Limitations  Lifting;Writing;House hold activities    Currently in Pain?  Yes    Pain Score  4     Pain Location  Shoulder    Pain Orientation  Left    Pain Descriptors / Indicators  Aching    Pain Type  Chronic pain    Pain Onset  More than a month ago    Pain Frequency  Intermittent                               PT Education - 08/26/17 1512    Education provided  Yes    Education Details  Pt ed on shoulder mechanics with functional activities, reaching, lifting, wheelchair propulsion, transfers, and lifts for pressure relief.     Person(s) Educated  Patient    Methods  Explanation;Demonstration    Comprehension  Verbalized understanding;Returned demonstration       PT Short Term Goals - 08/05/17 1004      PT SHORT TERM GOAL #1   Title  Pt to be independent with initial HEP    Time  3    Period  Weeks    Status  New    Target Date  08/26/17        PT Long Term Goals - 08/05/17 1005      PT LONG TERM GOAL #1   Title  Pt to demo improved shoulder PROM and AROM to be WNL (equal to R side) to improve ability for reaching, lifting, and wheelchair mobility.     Time  6    Period  Weeks    Status  New    Target Date  09/15/17      PT LONG TERM GOAL #2   Title  Pt to demo increased strength of L shoulder and scapular muscles, to at least 4+/5 to improve ability for transfers and wheelchair mobility     Time  6    Period  Weeks    Status  New    Target Date  09/15/17      PT LONG TERM GOAL #3   Title  Pt to demo ability for pivot transfer with proper shoulder mechanics, to improve pain and over use     Time  6    Period  Weeks    Status  New    Target Date  09/15/17            Plan - 08/26/17 1550    Clinical Impression Statement  Pt with improving ROM for L shoulder. She was educated at length today, about shoulder posture and mechanics with ther ex, transfers, wheelchair mobility, and reaching. Pt with difficulty keeping shoulders in correct posture, partially due to lumbar positioning in wheelchair and being positioned in posterior pelvic tilt. Pt likely getting overuse of shoulder due to repeated motions in wheelchair and posture with wheelchair mobility. Ther ex for strengthening of shoulders and postural muscles performed today. Plan to see pt 1-2 more visits for shoulder.     Rehab Potential  Good    PT Frequency  2x / week    PT  Duration  6 weeks    PT Treatment/Interventions  ADLs/Self Care Home Management;Cryotherapy;Electrical Stimulation;Iontophoresis 83m/ml Dexamethasone;Moist Heat;Therapeutic activities;Functional mobility training;DME Instruction;Ultrasound;Therapeutic exercise;Neuromuscular re-education;Patient/family education;Dry needling;Passive range of motion;Manual techniques;Wheelchair mobility training;Taping;Vasopneumatic Device    PT Next Visit Plan  Scapular stabilization, Rotator cuff strengthening.     PT Home Exercise Plan  Add RTC and scapular strengthening for HEP     Consulted and Agree with Plan of Care  Patient       Patient will benefit from skilled therapeutic intervention in order to improve the following deficits and impairments:  Decreased activity tolerance, Decreased strength, Impaired UE functional use, Pain, Decreased range of motion  Visit Diagnosis: Chronic left shoulder pain  Stiffness of left shoulder, not elsewhere classified     Problem List Patient Active Problem List   Diagnosis Date Noted  . Neurogenic bowel 11/08/2013  . Anxiety 05/21/2013  . Urinary incontinence 05/09/2013  . Neurogenic bladder 11/14/2012  . Vitamin D deficiency 10/29/2011  . Osteoporosis 10/22/2011  . [place] ([place] 07/02/2007  . PARAPLEGIA 07/02/2007  . RECTAL INCONTINENCE 07/02/2007  . History of spinal cord injury 07/02/2007    LLyndee Hensen PT, DPT 3:53 PM  08/26/17    [place] [address]Phone: [phone]  Fax:  [phone] Name: Catherine NORENBERGMRN: [id number] of Birth: 1Dec 29, 1962   PHYSICAL THERAPY DISCHARGE SUMMARY  Visits from Start of Care : 3 Plan: Patient agrees to discharge.  Patient goals were partially met. Patient is being discharged due to                                                     ?????     Pt had appt on 09/13/17, showed up about 20 min  late. Appt cancelled for that day, but discussed treatment plan with patient. Pt states that she continues to have mild pain in shoulder with wheelchair  mobility, likely due to overuse and possibly from posture in chair.  She continues to have several questions about wheelchair positioning and mobility. Plan to refer pt to [facility] PT clinic for evaluation and treatment of this. Pt in agreement with plan. D/c at this time.    Lyndee Hensen, PT, DPT 1:28 PM  09/23/17

## 2017-08-30 DIAGNOSIS — F419 Anxiety disorder, unspecified: Secondary | ICD-10-CM | POA: Diagnosis not present

## 2017-09-13 ENCOUNTER — Encounter: Payer: Medicare Other | Admitting: Physical Therapy

## 2017-09-14 ENCOUNTER — Ambulatory Visit: Payer: Medicare Other | Admitting: Family Medicine

## 2017-09-15 ENCOUNTER — Other Ambulatory Visit: Payer: Self-pay | Admitting: Family Medicine

## 2017-09-15 DIAGNOSIS — Z87828 Personal history of other (healed) physical injury and trauma: Secondary | ICD-10-CM

## 2017-09-15 DIAGNOSIS — G8220 Paraplegia, unspecified: Secondary | ICD-10-CM

## 2017-09-15 DIAGNOSIS — Z7409 Other reduced mobility: Secondary | ICD-10-CM

## 2017-09-15 NOTE — Telephone Encounter (Signed)
Error

## 2017-09-15 NOTE — Telephone Encounter (Signed)
-----   Message from Lyndee Hensen, PT sent at 09/14/2017 12:14 PM EDT ----- Hi !  I am seeing Catherine Oneal for Physical Therapy for her shoulder. I have seen her for a few visits. She has several questions about her wheelchair positioning, and mobility, and also use of her standing frame. She has not had much guidance on this in the last several years. I am not the expert to help her with this, but think it would be great if we could get her scheduled with the therapists at the [facility], they would definitely be able to help her. I told her I would ask you about getting a referral for this. If you agree, please put in a Physical therapy referral, -eval and treat-, diagnosis that would be appropriate would be decreased mobility, or something along those lines. Ill check for the referral, and ill have the PT clinic call to get her scheduled. Thanks so much!  Lyndee Hensen, PT, DPT [facility]

## 2017-09-15 NOTE — Progress Notes (Signed)
Hi !  I am seeing Zaia Carre for Physical Therapy for her shoulder. I have seen her for a few visits. She has several questions about her wheelchair positioning, and mobility, and also use of her standing frame. She has not had much guidance on this in the last several years. I am not the expert to help her with this, but think it would be great if we could get her scheduled with the therapists at the [facility], they would definitely be able to help her. I told her I would ask you about getting a referral for this. If you agree, please put in a Physical therapy referral, -eval and treat-, diagnosis that would be appropriate would be decreased mobility, or something along those lines. Ill check for the referral, and ill have the PT clinic call to get her scheduled. Thanks so much!  Lyndee Hensen, PT, DPT  [facility]   Referral placed.

## 2017-09-27 ENCOUNTER — Telehealth: Payer: Self-pay | Admitting: Gastroenterology

## 2017-09-27 NOTE — Telephone Encounter (Signed)
I have spoken with the patient. She has not followed her diet, does not have the prep. Pharmacy does not have a prescription. Patient is a paraplegic. She does not have assistance for the procedure. I have moved her to the next hospital day. She is asking about Cologuard. Would this be an option for her?

## 2017-09-27 NOTE — Telephone Encounter (Signed)
Pt has called and said she has not been on a clear liquid diet or followed the diet.  Asking to speak with the nurse

## 2017-09-28 NOTE — Telephone Encounter (Signed)
Patient had normal colonoscopy in 2008.  No family history of colon cancer.  Given currently has no GI symptoms or rectal bleeding and is average risk for colorectal cancer screening.  Okay to proceed with Cologuard.  If negative she will need repeat Cologuard in 3 years but if it is positive, will need to follow-up with colonoscopy

## 2017-09-28 NOTE — Telephone Encounter (Signed)
ok 

## 2017-09-28 NOTE — Telephone Encounter (Signed)
It is okay to order Cologuard?

## 2017-09-28 NOTE — Telephone Encounter (Signed)
Left information on her voicemail. Ordered the Cologuard. I asked the patient to call and confirm before I cancel her hospital procedure.

## 2017-10-01 ENCOUNTER — Other Ambulatory Visit: Payer: Self-pay

## 2017-10-01 ENCOUNTER — Encounter: Payer: Self-pay | Admitting: Family Medicine

## 2017-10-01 ENCOUNTER — Ambulatory Visit (INDEPENDENT_AMBULATORY_CARE_PROVIDER_SITE_OTHER): Payer: Medicare Other | Admitting: Family Medicine

## 2017-10-01 VITALS — BP 114/68 | HR 78 | Temp 98.1°F | Ht 69.0 in | Wt 133.0 lb

## 2017-10-01 DIAGNOSIS — F39 Unspecified mood [affective] disorder: Secondary | ICD-10-CM

## 2017-10-01 DIAGNOSIS — R921 Mammographic calcification found on diagnostic imaging of breast: Secondary | ICD-10-CM

## 2017-10-01 DIAGNOSIS — Z87828 Personal history of other (healed) physical injury and trauma: Secondary | ICD-10-CM

## 2017-10-01 DIAGNOSIS — N950 Postmenopausal bleeding: Secondary | ICD-10-CM

## 2017-10-01 LAB — CBC WITH DIFFERENTIAL/PLATELET
BASOS ABS: 0.1 10*3/uL (ref 0.0–0.1)
Basophils Relative: 0.8 % (ref 0.0–3.0)
EOS ABS: 0.1 10*3/uL (ref 0.0–0.7)
Eosinophils Relative: 1.2 % (ref 0.0–5.0)
HEMATOCRIT: 39.8 % (ref 36.0–46.0)
Hemoglobin: 13.3 g/dL (ref 12.0–15.0)
Lymphocytes Relative: 33.8 % (ref 12.0–46.0)
Lymphs Abs: 2.2 10*3/uL (ref 0.7–4.0)
MCHC: 33.5 g/dL (ref 30.0–36.0)
MCV: 96.6 fl (ref 78.0–100.0)
MONOS PCT: 5.8 % (ref 3.0–12.0)
Monocytes Absolute: 0.4 10*3/uL (ref 0.1–1.0)
NEUTROS ABS: 3.9 10*3/uL (ref 1.4–7.7)
Neutrophils Relative %: 58.4 % (ref 43.0–77.0)
Platelets: 280 10*3/uL (ref 150.0–400.0)
RBC: 4.12 Mil/uL (ref 3.87–5.11)
RDW: 12.4 % (ref 11.5–15.5)
WBC: 6.6 10*3/uL (ref 4.0–10.5)

## 2017-10-01 LAB — TSH: TSH: 3.01 u[IU]/mL (ref 0.35–4.50)

## 2017-10-01 LAB — FOLLICLE STIMULATING HORMONE: FSH: 119.9 m[IU]/mL

## 2017-10-01 NOTE — Patient Instructions (Addendum)
Please follow up if symptoms do not improve or as needed.   I will release your lab results to you on your MyChart account with further instructions. Please reply with any questions.   We will call you with information regarding your referral appointment. Pelvic ultrasound. If you do not hear from [place] within the next 2 weeks, please let me know. It can take 1-2 weeks to get appointments set up with the specialists.

## 2017-10-01 NOTE — Progress Notes (Signed)
Subjective  CC:  Chief Complaint  Patient presents with  . Menstrual Problem    had some period like bleeding x 4 weeks ago, happened that one time  . Mammogram    wants to discuss recent mammogram   . Referral    Discuss PT Referral     HPI: Catherine Oneal is a 57 y.o. female who presents to the office today to address the problems listed above in the chief complaint.  One week of menstrual like bleeding last month. No trauma. Not sexually active. No STD risk. Had early menopause in her 59s. G0; rare birth control.   Reviewed mammo results form June. likley benign calcifications for f/u in 6 months. Had bilateral breast pain since resolved.   Neuro rehab referral for spinal cord injury: with scoliosis and assymetry. hasn't had call back yet.    Assessment  1. Postmenopausal bleeding   2. Breast calcifications on mammogram   3. Episodic mood disorder (HCC)   4. History of spinal cord injury      Plan   postmeno bleeding:  Needs eval. Start with pelvic ultrasounds. If lining is < 48mm, ok; if not, will refer to gyn for biopsy . Check labs  Discussed abnl mammo; reassured. Recheck in 6 months.   Mood is stable. Doing well. Check tsh  Pt to call neuro rehab to schedule appt .referral was placed and pt couldn't be reached.   Follow up: prn   Orders Placed This Encounter  Procedures  . US PELVIC COMPLETE WITH TRANSVAGINAL  . [place]  . TSH  . Prolactin  . CBC with Differential/Platelet   No orders of the defined types were placed in this encounter.     I reviewed the patients updated PMH, FH, and SocHx.    Patient Active Problem List   Diagnosis Date Noted  . Neurogenic bowel 11/08/2013  . Anxiety 05/21/2013  . Urinary incontinence 05/09/2013  . Neurogenic bladder 11/14/2012  . Vitamin D deficiency 10/29/2011  . Osteoporosis 10/22/2011  . Episodic mood disorder ([place]) 07/02/2007  . PARAPLEGIA 07/02/2007  . RECTAL INCONTINENCE 07/02/2007  . History of  spinal cord injury 07/02/2007   Current Meds  Medication Sig  . busPIRone (BUSPAR) 7.5 MG tablet   . clonazePAM (KLONOPIN) 0.5 MG tablet Take 0.5 mg by mouth. PRN  . docusate sodium (COLACE) 100 MG capsule Take by mouth daily.  . Multiple Vitamin (MULTI-VITAMINS) TABS Take by mouth.  Marland Kitchen omeprazole (PRILOSEC) 20 MG capsule TAKE 1 CAPSULE (20 MG TOTAL) BY MOUTH CONTINUOUS AS NEEDED.  Marland Kitchen oxybutynin (DITROPAN) 5 MG tablet Take 1 tablet (5 mg total) by mouth 3 (three) times daily.  . sertraline (ZOLOFT) 100 MG tablet Take 100 mg by mouth.  . ziprasidone (GEODON) 20 MG capsule Take 20 mg by mouth 2 (two) times daily with a meal.  . zolpidem (AMBIEN) 10 MG tablet Take 10 mg by mouth. PRN    Allergies: Patient has No Known Allergies. Family History: Patient family history includes Breast cancer in her paternal aunt; Colon polyps in her mother; Diabetes in her father, maternal grandmother, and mother. Social History:  Patient  reports that she has never smoked. She has never used smokeless tobacco. She reports that she drinks alcohol. She reports that she does not use drugs.  Review of Systems: Constitutional: Negative for fever malaise or anorexia Cardiovascular: negative for chest pain Respiratory: negative for SOB or persistent cough Gastrointestinal: negative for abdominal pain  Objective  Vitals: BP  114/68   Pulse 78   Temp 98.1 F (36.7 C)   Ht 5\' 9"  (1.753 m)   Wt 133 lb (60.3 kg)   SpO2 98%   BMI 19.64 kg/m  General: no acute distress , A&Ox3   Commons side effects, risks, benefits, and alternatives for medications and treatment plan prescribed today were discussed, and the patient expressed understanding of the given instructions. Patient is instructed to call or message via MyChart if he/she has any questions or concerns regarding our treatment plan. No barriers to understanding were identified. We discussed Red Flag symptoms and signs in detail. Patient expressed  understanding regarding what to do in case of urgent or emergency type symptoms.   Medication list was reconciled, printed and provided to the patient in AVS. Patient instructions and summary information was reviewed with the patient as documented in the AVS. This note was prepared with assistance of Dragon voice recognition software. Occasional wrong-word or sound-a-like substitutions may have occurred due to the inherent limitations of voice recognition software

## 2017-10-02 LAB — PROLACTIN: PROLACTIN: 13.2 ng/mL

## 2017-10-05 ENCOUNTER — Other Ambulatory Visit: Payer: Self-pay

## 2017-10-05 MED ORDER — NA SULFATE-K SULFATE-MG SULF 17.5-3.13-1.6 GM/177ML PO SOLN: ORAL | 0 refills | Status: DC

## 2017-10-05 NOTE — Telephone Encounter (Signed)
Called patient. She has decided she wants to go through with the colonoscopy instead of Cologuard.  Paraplegic. /Instructions will be mailed to her with a prescription printed. She understands she needs someone to come with her to the procedure date.

## 2017-10-12 ENCOUNTER — Other Ambulatory Visit: Payer: Medicare Other

## 2017-10-19 ENCOUNTER — Other Ambulatory Visit: Payer: Medicare Other

## 2017-10-25 ENCOUNTER — Telehealth: Payer: Self-pay | Admitting: Gastroenterology

## 2017-10-25 NOTE — Telephone Encounter (Signed)
Very confusing. I have left her a message. Need to know which she would prefer to do for her screening.

## 2017-10-25 NOTE — Telephone Encounter (Signed)
Patient returned phone call. [facility] # [phone]. Patient says that she wants to schedule her colonoscopy. She is paraplegic. Does this need to be scheduled at hospital?

## 2017-10-26 ENCOUNTER — Ambulatory Visit (HOSPITAL_COMMUNITY): Admission: RE | Admit: 2017-10-26 | Payer: Medicare Other | Source: Ambulatory Visit | Admitting: Gastroenterology

## 2017-10-26 ENCOUNTER — Encounter (HOSPITAL_COMMUNITY): Admission: RE | Payer: Self-pay | Source: Ambulatory Visit

## 2017-10-26 SURGERY — COLONOSCOPY WITH PROPOFOL
Anesthesia: Monitor Anesthesia Care

## 2017-10-26 NOTE — Telephone Encounter (Signed)
Yes this will be a hospital procedure. It will be 12/20/17. I am trying to arrange it as the 3rd case so it is not too early in the morning for her arrival time.

## 2017-10-27 ENCOUNTER — Ambulatory Visit
Admission: RE | Admit: 2017-10-27 | Discharge: 2017-10-27 | Disposition: A | Discharge status: Home / Self Care | Payer: Medicare Other | Source: Ambulatory Visit | Attending: Family Medicine | Admitting: Family Medicine

## 2017-10-27 DIAGNOSIS — D252 Subserosal leiomyoma of uterus: Secondary | ICD-10-CM | POA: Diagnosis not present

## 2017-10-27 DIAGNOSIS — N950 Postmenopausal bleeding: Secondary | ICD-10-CM

## 2017-10-27 NOTE — Telephone Encounter (Signed)
email to the patient

## 2017-10-29 ENCOUNTER — Other Ambulatory Visit: Payer: Self-pay | Admitting: Emergency Medicine

## 2017-10-29 DIAGNOSIS — N950 Postmenopausal bleeding: Secondary | ICD-10-CM

## 2017-10-29 NOTE — Telephone Encounter (Signed)
Returning your call. °

## 2017-11-10 NOTE — Telephone Encounter (Signed)
Left message to call back  

## 2017-11-16 DIAGNOSIS — F419 Anxiety disorder, unspecified: Secondary | ICD-10-CM | POA: Diagnosis not present

## 2017-11-23 DIAGNOSIS — N950 Postmenopausal bleeding: Secondary | ICD-10-CM | POA: Diagnosis not present

## 2017-11-24 DIAGNOSIS — N1330 Unspecified hydronephrosis: Secondary | ICD-10-CM | POA: Diagnosis not present

## 2017-11-24 DIAGNOSIS — N319 Neuromuscular dysfunction of bladder, unspecified: Secondary | ICD-10-CM | POA: Diagnosis not present

## 2017-11-24 NOTE — Telephone Encounter (Signed)
Email to the patient.

## 2017-11-29 DIAGNOSIS — F411 Generalized anxiety disorder: Secondary | ICD-10-CM | POA: Diagnosis not present

## 2017-11-30 DIAGNOSIS — F419 Anxiety disorder, unspecified: Secondary | ICD-10-CM | POA: Diagnosis not present

## 2017-12-07 DIAGNOSIS — F419 Anxiety disorder, unspecified: Secondary | ICD-10-CM | POA: Diagnosis not present

## 2017-12-08 DIAGNOSIS — R9389 Abnormal findings on diagnostic imaging of other specified body structures: Secondary | ICD-10-CM | POA: Diagnosis not present

## 2017-12-08 DIAGNOSIS — N858 Other specified noninflammatory disorders of uterus: Secondary | ICD-10-CM | POA: Diagnosis not present

## 2017-12-08 DIAGNOSIS — N950 Postmenopausal bleeding: Secondary | ICD-10-CM | POA: Diagnosis not present

## 2017-12-08 DIAGNOSIS — Z124 Encounter for screening for malignant neoplasm of cervix: Secondary | ICD-10-CM | POA: Diagnosis not present

## 2017-12-08 LAB — HM PAP SMEAR: HM Pap smear: NEGATIVE

## 2017-12-16 DIAGNOSIS — F419 Anxiety disorder, unspecified: Secondary | ICD-10-CM | POA: Diagnosis not present

## 2017-12-21 DIAGNOSIS — F419 Anxiety disorder, unspecified: Secondary | ICD-10-CM | POA: Diagnosis not present

## 2017-12-23 DIAGNOSIS — N950 Postmenopausal bleeding: Secondary | ICD-10-CM | POA: Diagnosis not present

## 2018-01-12 DIAGNOSIS — F419 Anxiety disorder, unspecified: Secondary | ICD-10-CM | POA: Diagnosis not present

## 2018-01-24 ENCOUNTER — Other Ambulatory Visit: Payer: Self-pay

## 2018-01-24 ENCOUNTER — Ambulatory Visit (INDEPENDENT_AMBULATORY_CARE_PROVIDER_SITE_OTHER): Payer: Medicare Other | Admitting: Family Medicine

## 2018-01-24 ENCOUNTER — Encounter: Payer: Self-pay | Admitting: Family Medicine

## 2018-01-24 VITALS — BP 118/70 | HR 80 | Temp 97.6°F | Resp 14 | Ht 69.0 in

## 2018-01-24 DIAGNOSIS — N319 Neuromuscular dysfunction of bladder, unspecified: Secondary | ICD-10-CM | POA: Diagnosis not present

## 2018-01-24 DIAGNOSIS — M816 Localized osteoporosis [Lequesne]: Secondary | ICD-10-CM

## 2018-01-24 DIAGNOSIS — F419 Anxiety disorder, unspecified: Secondary | ICD-10-CM | POA: Diagnosis not present

## 2018-01-24 DIAGNOSIS — G8929 Other chronic pain: Secondary | ICD-10-CM

## 2018-01-24 DIAGNOSIS — F39 Unspecified mood [affective] disorder: Secondary | ICD-10-CM

## 2018-01-24 DIAGNOSIS — M25512 Pain in left shoulder: Secondary | ICD-10-CM

## 2018-01-24 DIAGNOSIS — R921 Mammographic calcification found on diagnostic imaging of breast: Secondary | ICD-10-CM

## 2018-01-24 DIAGNOSIS — K592 Neurogenic bowel, not elsewhere classified: Secondary | ICD-10-CM | POA: Diagnosis not present

## 2018-01-24 NOTE — Progress Notes (Signed)
Subjective  CC:  Chief Complaint  Patient presents with  . Shoulder Pain  . Depression  . Vaginal Bleeding    had the biopsy; rec hysteroscopy    HPI: Catherine Oneal is a 57 y.o. female who presents to the office today to address the problems listed above in the chief complaint, mood problems.  Follow-up mood: Doing great.  No longer having symptoms of depression or anxiety.  Doing well on chronic psych medications without side effects.  Continues to see psychiatric nurse practitioner.  Has a Forensic scientist from [place] which has helped her mood considerably.  They are active and traveling and happy.  Persistent shoulder pain, left rotator cuff status post physical therapy in June.  Continues to improve.  She does well if she continues her exercises at home.  No new symptoms.  History of postmenopausal vaginal bleeding: Reported endometrial biopsy.  Likely due from a endometrial polyp.  GYN recommends hysteroscopy.  Patient defers for now but will get done early next year.  No further episodes of bleeding.  History of abnormal mammogram due for repeat in December.  History of osteoporosis: Due for repeat bone density  Health maintenance colorectal cancer screening not yet done.  Trying to decide between Cologuard and colonoscopy.  She has seen GI for her fecal incontinence which is improving.  To my knowledge, they have not recommended colonoscopy for that reason. Depression screen [facility] 2/9 01/24/2018 06/22/2017 04/13/2017  Decreased Interest 0 0 0  Down, Depressed, Hopeless 0 0 0  PHQ - 2 Score 0 0 0  Altered sleeping 0 0 -  Tired, decreased energy 0 0 -  Change in appetite 0 0 -  Feeling bad or failure about yourself  0 0 -  Trouble concentrating 0 0 -  Moving slowly or fidgety/restless 0 0 -  Suicidal thoughts 0 0 -  PHQ-9 Score 0 0 -  Difficult doing work/chores - Not difficult at all -     Assessment  1. Episodic mood disorder ([place])   2. Anxiety   3. Breast  calcifications on mammogram   4. Chronic left shoulder pain   5. Localized osteoporosis without current pathological fracture   6. Neurogenic bowel   7. Neurogenic bladder      Plan   Depression: Mood disorder is well controlled.  Ordered bone density to be done in December with repeat mammogram.  Shoulder pain is stable  Follow-up with urology for neurogenic bladder  Follow-up with gastroenterology for neurogenic bowels  Recommend Cologuard for average risk colorectal cancer screening.  Follow up: Return in about 3 months (around 04/26/2018) for complete physical.  Orders Placed This Encounter  Procedures  . DG Bone Density   No orders of the defined types were placed in this encounter.     I reviewed the patients updated PMH, FH, and SocHx.    Patient Active Problem List   Diagnosis Date Noted  . Neurogenic bowel 11/08/2013  . Anxiety 05/21/2013  . Urinary incontinence 05/09/2013  . Neurogenic bladder 11/14/2012  . Vitamin D deficiency 10/29/2011  . Osteoporosis 10/22/2011  . Episodic mood disorder ([place]) 07/02/2007  . PARAPLEGIA 07/02/2007  . RECTAL INCONTINENCE 07/02/2007  . History of spinal cord injury 07/02/2007   Current Meds  Medication Sig  . busPIRone (BUSPAR) 7.5 MG tablet   . clonazePAM (KLONOPIN) 0.5 MG tablet Take 0.5 mg by mouth. PRN  . docusate sodium (COLACE) 100 MG capsule Take by mouth daily.  . Multiple Vitamin (  MULTI-VITAMINS) TABS Take by mouth.  . Na Sulfate-K Sulfate-Mg Sulf 17.5-3.13-1.6 GM/177ML SOLN Take orally as directed by the physician instructions for colonoscopy  . omeprazole (PRILOSEC) 20 MG capsule TAKE 1 CAPSULE (20 MG TOTAL) BY MOUTH CONTINUOUS AS NEEDED.  Marland Kitchen oxybutynin (DITROPAN) 5 MG tablet Take 1 tablet (5 mg total) by mouth 3 (three) times daily.  . sertraline (ZOLOFT) 100 MG tablet Take 100 mg by mouth.  . ziprasidone (GEODON) 20 MG capsule Take 20 mg by mouth 2 (two) times daily with a meal.  . zolpidem (AMBIEN) 10 MG  tablet Take 10 mg by mouth. PRN    Allergies: Patient has No Known Allergies. Family history:  Patient family history includes Breast cancer in her paternal aunt; Colon polyps in her mother; Diabetes in her father, maternal grandmother, and mother. Social History   Socioeconomic History  . Marital status: Single    Spouse name: Not on file  . Number of children: 0  . Years of education: Not on file  . Highest education level: Not on file  Occupational History  . Occupation: Disabled  . Occupation: Scientist, research (medical), Freight forwarder  Social Needs  . Financial resource strain: Not on file  . Food insecurity:    Worry: Not on file    Inability: Not on file  . Transportation needs:    Medical: Not on file    Non-medical: Not on file  Tobacco Use  . Smoking status: Never Smoker  . Smokeless tobacco: Never Used  Substance and Sexual Activity  . Alcohol use: Yes    Comment: occasional  . Drug use: No  . Sexual activity: Never  Lifestyle  . Physical activity:    Days per week: Not on file    Minutes per session: Not on file  . Stress: Not on file  Relationships  . Social connections:    Talks on phone: Not on file    Gets together: Not on file    Attends religious service: Not on file    Active member of club or organization: Not on file    Attends meetings of clubs or organizations: Not on file    Relationship status: Not on file  Other Topics Concern  . Not on file  Social History Narrative  . Not on file     Review of Systems: Constitutional: Negative for fever malaise or anorexia Cardiovascular: negative for chest pain Respiratory: negative for SOB or persistent cough Gastrointestinal: negative for abdominal pain  Objective  Vitals: BP 118/70   Pulse 80   Temp 97.6 F (36.4 C) (Oral)   Resp 14   Ht 5\' 9"  (1.753 m)   SpO2 97%   BMI 19.64 kg/m  General: no acute distress, well appearing, no apparent distress, well groomed Psych:  Alert and oriented x 3,normal  affect, appears happy   Commons side effects, risks, benefits, and alternatives for medications and treatment plan prescribed today were discussed, and the patient expressed understanding of the given instructions. Patient is instructed to call or message via MyChart if he/she has any questions or concerns regarding our treatment plan. No barriers to understanding were identified. We discussed Red Flag symptoms and signs in detail. Patient expressed understanding regarding what to do in case of urgent or emergency type symptoms.   Medication list was reconciled, printed and provided to the patient in AVS. Patient instructions and summary information was reviewed with the patient as documented in the AVS. This note was prepared with assistance of Dragon  voice recognition software. Occasional wrong-word or sound-a-like substitutions may have occurred due to the inherent limitations of voice recognition software

## 2018-01-24 NOTE — Patient Instructions (Addendum)
Please return in February or march for your annual complete physical; please come fasting.  We will set up your dexa scan in December with your follow up mammogram.  Do the Cologuard and send it in for colon cancer screening.   If you have any questions or concerns, please don't hesitate to send me a message via MyChart or call the office at [phone]. Thank you for visiting with [place] today! It's our pleasure caring for you.

## 2018-01-25 DIAGNOSIS — F419 Anxiety disorder, unspecified: Secondary | ICD-10-CM | POA: Diagnosis not present

## 2018-02-16 DIAGNOSIS — F419 Anxiety disorder, unspecified: Secondary | ICD-10-CM | POA: Diagnosis not present

## 2018-02-17 ENCOUNTER — Ambulatory Visit (INDEPENDENT_AMBULATORY_CARE_PROVIDER_SITE_OTHER): Payer: Medicare Other | Admitting: Gastroenterology

## 2018-02-17 ENCOUNTER — Encounter: Payer: Self-pay | Admitting: Gastroenterology

## 2018-02-17 VITALS — BP 90/60 | HR 88 | Ht 69.0 in | Wt 133.0 lb

## 2018-02-17 DIAGNOSIS — Z1211 Encounter for screening for malignant neoplasm of colon: Secondary | ICD-10-CM | POA: Diagnosis not present

## 2018-02-17 DIAGNOSIS — G8220 Paraplegia, unspecified: Secondary | ICD-10-CM

## 2018-02-17 DIAGNOSIS — K592 Neurogenic bowel, not elsewhere classified: Secondary | ICD-10-CM

## 2018-02-17 DIAGNOSIS — N319 Neuromuscular dysfunction of bladder, unspecified: Secondary | ICD-10-CM | POA: Diagnosis not present

## 2018-02-17 DIAGNOSIS — K5900 Constipation, unspecified: Secondary | ICD-10-CM

## 2018-02-17 MED ORDER — OMEPRAZOLE 20 MG PO CPDR: 20.0000 mg | DELAYED_RELEASE_CAPSULE | Freq: Every day | ORAL | 3 refills | Status: DC

## 2018-02-17 NOTE — Patient Instructions (Signed)
Take Over the counter FD Gard 1 capsule three times a day as needed  We have given you samples of Amitiza 8 mcg twice a day to try  You can purchase dulcolax suppositories over the counter as needed   Your provider has ordered Cologuard testing as an option for colon cancer screening. This is performed by [employer] [facility] and may be out of network with your insurance. PRIOR to completing the test, it is YOUR responsibility to contact your insurance about covered benefits for this test. Your out of pocket expense could be anywhere from $0.00 to $649.00.   When you call to check coverage with your insurer, please provide the following information:   -The ONLY provider of Cologuard is [place] code for Cologuard is [phone].  [employer] NPI # [id number]  -[employer] Tax ID # [id number]   We have already sent your demographic and insurance information to [facility] (phone number [phone]) and they should contact you within the next week regarding your test. If you have not heard from them within the next week, please call our office at [phone].

## 2018-02-17 NOTE — Progress Notes (Signed)
Catherine Oneal    [id number]    1960/12/23  Primary Care Physician:Andy, Karie Fetch, MD  Referring Physician: Leamon Arnt, MD [address] [place] [address] [place], [place] [address]  Chief complaint: Constipation  HPI: 57 year old female previously followed by Dr. Olevia Perches, last seen in office by Nicoletta Ba in April 2019 is here for follow-up visit. History of spinal cord injury with paraplegia, neurogenic bowel and bladder. In the past few months she is having worsening constipation, unable to evacuate.  She has to give herself a tap water enema once every 2 to 3 days.  She has been doing digital rectal stimulation to initiate bowel movement every other day. She was recommended to start taking Metamucil and probiotics to improve fecal incontinence and diarrhea, was causing increased bloating and flatulence, discontinued it. Denies any rectal bleeding.  No family history of colon cancer  Colonoscopy July 2008 normal.  Due for colorectal cancer screening.  Outpatient Encounter Medications as of 02/17/2018  Medication Sig  . busPIRone (BUSPAR) 7.5 MG tablet   . clonazePAM (KLONOPIN) 0.5 MG tablet Take 0.5 mg by mouth. PRN  . docusate sodium (COLACE) 100 MG capsule Take by mouth daily.  . Multiple Vitamin (MULTI-VITAMINS) TABS Take by mouth.  . Na Sulfate-K Sulfate-Mg Sulf 17.5-3.13-1.6 GM/177ML SOLN Take orally as directed by the physician instructions for colonoscopy  . omeprazole (PRILOSEC) 20 MG capsule Take 1 capsule (20 mg total) by mouth daily.  Marland Kitchen oxybutynin (DITROPAN) 5 MG tablet Take 1 tablet (5 mg total) by mouth 3 (three) times daily.  . sertraline (ZOLOFT) 100 MG tablet Take 100 mg by mouth.  . ziprasidone (GEODON) 20 MG capsule Take 20 mg by mouth 2 (two) times daily with a meal.  . zolpidem (AMBIEN) 10 MG tablet Take 10 mg by mouth. PRN  . [DISCONTINUED] omeprazole (PRILOSEC) 20 MG capsule TAKE 1 CAPSULE (20 MG TOTAL) BY MOUTH CONTINUOUS AS NEEDED.   No  facility-administered encounter medications on file as of 02/17/2018.     Allergies as of 02/17/2018  . (No Known Allergies)    Past Medical History:  Diagnosis Date  . Anxiety   . Helicobacter pylori (H. pylori) infection   . Paraplegia ([place])   . Rectal incontinence   . Spinal injury     Past Surgical History:  Procedure Laterality Date  . BACK SURGERY    . BREATH TEK H PYLORI N/A 12/09/2012   Procedure: BREATH TEK H PYLORI;  Surgeon: Lafayette Dragon, MD;  Location: [facility] ENDOSCOPY;  Service: Endoscopy;  Laterality: N/A;  . ESOPHAGOGASTRODUODENOSCOPY N/A 10/06/2012   Procedure: ESOPHAGOGASTRODUODENOSCOPY (EGD);  Surgeon: Lafayette Dragon, MD;  Location: [facility];  Service: Endoscopy;  Laterality: N/A;  patient is paraplegic  . UPPER GASTROINTESTINAL ENDOSCOPY      Family History  Problem Relation Age of Onset  . Breast cancer Paternal Aunt   . Diabetes Maternal Grandmother   . Diabetes Father   . Colon polyps Mother   . Diabetes Mother   . Colon cancer Neg Hx   . Esophageal cancer Neg Hx   . Stomach cancer Neg Hx   . Rectal cancer Neg Hx     Social History   Socioeconomic History  . Marital status: Single    Spouse name: Not on file  . Number of children: 0  . Years of education: Not on file  . Highest education level: Not on file  Occupational History  .  Occupation: Disabled  . Occupation: Scientist, research (medical), Freight forwarder  Social Needs  . Financial resource strain: Not on file  . Food insecurity:    Worry: Not on file    Inability: Not on file  . Transportation needs:    Medical: Not on file    Non-medical: Not on file  Tobacco Use  . Smoking status: Never Smoker  . Smokeless tobacco: Never Used  Substance and Sexual Activity  . Alcohol use: Yes    Comment: occasional  . Drug use: No  . Sexual activity: Never  Lifestyle  . Physical activity:    Days per week: Not on file    Minutes per session: Not on file  . Stress: Not on file  Relationships  . Social  connections:    Talks on phone: Not on file    Gets together: Not on file    Attends religious service: Not on file    Active member of club or organization: Not on file    Attends meetings of clubs or organizations: Not on file    Relationship status: Not on file  . Intimate partner violence:    Fear of current or ex partner: Not on file    Emotionally abused: Not on file    Physically abused: Not on file    Forced sexual activity: Not on file  Other Topics Concern  . Not on file  Social History Narrative  . Not on file      Review of systems: Review of Systems  Constitutional: Negative for fever and chills.  HENT: Negative.   Eyes: Negative for blurred vision.  Respiratory: Negative for cough, shortness of breath and wheezing.   Cardiovascular: Negative for chest pain and palpitations.  Gastrointestinal: as per HPI Genitourinary: Negative for dysuria, urgency, frequency and hematuria.  Musculoskeletal: Negative for myalgias, back pain and joint pain.  Skin: Negative for itching and rash.  Neurological: Negative for dizziness, tremors, focal weakness, seizures and loss of consciousness.  Endo/Heme/Allergies: Positive for seasonal allergies.  Psychiatric/Behavioral: Negative for depression, suicidal ideas and hallucinations.  All other systems reviewed and are negative.   Physical Exam: Vitals:   02/17/18 0906  BP: 90/60  Pulse: 88   Body mass index is 19.64 kg/m. Gen:      No acute distress, in wheel chair HEENT:  EOMI, sclera anicteric Neck:     No masses; no thyromegaly Lungs:    Clear to auscultation bilaterally; normal respiratory effort CV:         Regular rate and rhythm; no murmurs Abd:      + bowel sounds; soft, non-tender; no palpable masses, no distension Ext:    No edema;  Skin:      Warm and dry; no rash Neuro: alert and oriented x 3 Psych: normal mood and affect  Data Reviewed:  Reviewed labs, radiology imaging, old records and pertinent past GI  work up   Assessment and Plan/Recommendations:  57 year old female paraplegic with neurogenic bladder and bowel with complaints of worsening constipation Start Amitiza 8 mcg twice daily Increase dietary fiber and fluid intake Advised patient to continue digital rectal stimulation and use Dulcolax suppository per rectum as needed Due for colorectal cancer screening.  Discussed different options Cologuard and colonoscopy Patient wants to proceed with Cologuard.  She is aware that if it is positive will need follow-up colonoscopy Abdominal bloating and dyspepsia: Trial of FD Gard 1 capsule up to 3 times daily as needed  25 minutes was spent face-to-face  with the patient. Greater than 50% of the time used for counseling as well as treatment plan and follow-up. She had multiple questions which were answered to her satisfaction  K. Denzil Magnuson , MD [phone]    CC: Leamon Arnt, MD

## 2018-02-21 DIAGNOSIS — F411 Generalized anxiety disorder: Secondary | ICD-10-CM | POA: Diagnosis not present

## 2018-02-22 DIAGNOSIS — F419 Anxiety disorder, unspecified: Secondary | ICD-10-CM | POA: Diagnosis not present

## 2018-02-24 DIAGNOSIS — Z803 Family history of malignant neoplasm of breast: Secondary | ICD-10-CM | POA: Diagnosis not present

## 2018-02-24 DIAGNOSIS — R921 Mammographic calcification found on diagnostic imaging of breast: Secondary | ICD-10-CM | POA: Diagnosis not present

## 2018-02-24 LAB — HM MAMMOGRAPHY

## 2018-02-25 ENCOUNTER — Encounter: Payer: Self-pay | Admitting: Emergency Medicine

## 2018-03-17 DIAGNOSIS — F419 Anxiety disorder, unspecified: Secondary | ICD-10-CM | POA: Diagnosis not present

## 2018-03-18 ENCOUNTER — Encounter: Payer: Self-pay | Admitting: *Deleted

## 2018-03-18 ENCOUNTER — Encounter: Payer: Self-pay | Admitting: Family Medicine

## 2018-03-18 DIAGNOSIS — N950 Postmenopausal bleeding: Secondary | ICD-10-CM | POA: Insufficient documentation

## 2018-03-24 DIAGNOSIS — M810 Age-related osteoporosis without current pathological fracture: Secondary | ICD-10-CM | POA: Diagnosis not present

## 2018-03-24 DIAGNOSIS — Z780 Asymptomatic menopausal state: Secondary | ICD-10-CM | POA: Diagnosis not present

## 2018-03-24 DIAGNOSIS — G8220 Paraplegia, unspecified: Secondary | ICD-10-CM | POA: Diagnosis not present

## 2018-03-24 LAB — HM DEXA SCAN

## 2018-03-28 ENCOUNTER — Encounter: Payer: Self-pay | Admitting: *Deleted

## 2018-03-30 ENCOUNTER — Telehealth: Payer: Self-pay | Admitting: Gastroenterology

## 2018-03-30 NOTE — Telephone Encounter (Signed)
Left message for the patient offering 04/11/2018 at the hospital.

## 2018-03-30 NOTE — Telephone Encounter (Signed)
Okay, please schedule the procedure at next available outpatient block at [facility] endoscopy and will need to check with bed management, if she can be admitted a day prior to the procedure

## 2018-03-30 NOTE — Telephone Encounter (Signed)
She is a paraplegic. I tried scheduling her for a colonoscopy 2 times last year. She came in for an office visit and opted for the cologuard testing. I have left her a message for a return call to discuss. If she wants to go ahead with scheduling the colonoscopy again, can she be admitted for the prep?

## 2018-03-31 ENCOUNTER — Encounter: Payer: Self-pay | Admitting: *Deleted

## 2018-03-31 DIAGNOSIS — F419 Anxiety disorder, unspecified: Secondary | ICD-10-CM | POA: Diagnosis not present

## 2018-04-06 ENCOUNTER — Other Ambulatory Visit: Payer: Self-pay

## 2018-04-06 DIAGNOSIS — K5900 Constipation, unspecified: Secondary | ICD-10-CM

## 2018-04-06 DIAGNOSIS — G8220 Paraplegia, unspecified: Secondary | ICD-10-CM

## 2018-04-06 NOTE — Telephone Encounter (Signed)
Pt returned your call. Pls call her again. °

## 2018-04-06 NOTE — Telephone Encounter (Signed)
Spoke with the patient and scheduled her colonoscopy for 04/11/2018 at 11:45 am. She needs new instructions and will pick them up at the office tomorrow.

## 2018-04-07 ENCOUNTER — Other Ambulatory Visit: Payer: Self-pay

## 2018-04-07 NOTE — Telephone Encounter (Signed)
New instructions for her colonoscopy at the front desk for her to pick up. Sample of [place] provided.

## 2018-04-08 NOTE — Telephone Encounter (Signed)
Called back to the patient and got her voicemail.  She has her instructions available through her My Chart patient portal. Let her a message about this and asked her to call back if she still wanted to cancel.

## 2018-04-08 NOTE — Telephone Encounter (Signed)
No answer. Left message that I have cancelled the procedure as she requested.  Dr Silverio Decamp is notified.  Please charge the late cancellation fee. I will notify the PCP of the patient's decision.

## 2018-04-08 NOTE — Telephone Encounter (Signed)
No answer again 

## 2018-04-08 NOTE — Telephone Encounter (Signed)
PT advised that she was not able to come by and pick up the instructions/kit for her colonoscopy procedure on Monday 04-11-18'@11' :45. Would like to cancel.Marland Kitchen JG

## 2018-04-11 ENCOUNTER — Ambulatory Visit (HOSPITAL_COMMUNITY): Admission: RE | Admit: 2018-04-11 | Payer: Medicare Other | Source: Home / Self Care | Admitting: Gastroenterology

## 2018-04-11 ENCOUNTER — Encounter (HOSPITAL_COMMUNITY): Admission: RE | Payer: Self-pay | Source: Home / Self Care

## 2018-04-11 SURGERY — COLONOSCOPY WITH PROPOFOL
Anesthesia: Monitor Anesthesia Care

## 2018-04-21 DIAGNOSIS — F419 Anxiety disorder, unspecified: Secondary | ICD-10-CM | POA: Diagnosis not present

## 2018-04-21 NOTE — Telephone Encounter (Signed)
Thank you for all of your work and for letting me know.

## 2018-04-21 NOTE — Telephone Encounter (Signed)
We have been unsuccessful in completing a colon cancer screening for this patient you kindly referred to [place].

## 2018-05-02 ENCOUNTER — Ambulatory Visit (INDEPENDENT_AMBULATORY_CARE_PROVIDER_SITE_OTHER): Payer: Medicare Other | Admitting: Family Medicine

## 2018-05-02 ENCOUNTER — Other Ambulatory Visit: Payer: Self-pay

## 2018-05-02 ENCOUNTER — Encounter: Payer: Self-pay | Admitting: Family Medicine

## 2018-05-02 VITALS — BP 122/78 | HR 81 | Temp 98.0°F | Resp 16 | Wt 135.0 lb

## 2018-05-02 DIAGNOSIS — M816 Localized osteoporosis [Lequesne]: Secondary | ICD-10-CM | POA: Diagnosis not present

## 2018-05-02 DIAGNOSIS — Z1212 Encounter for screening for malignant neoplasm of rectum: Secondary | ICD-10-CM | POA: Diagnosis not present

## 2018-05-02 DIAGNOSIS — G8220 Paraplegia, unspecified: Secondary | ICD-10-CM | POA: Diagnosis not present

## 2018-05-02 DIAGNOSIS — Z1211 Encounter for screening for malignant neoplasm of colon: Secondary | ICD-10-CM | POA: Diagnosis not present

## 2018-05-02 DIAGNOSIS — E559 Vitamin D deficiency, unspecified: Secondary | ICD-10-CM

## 2018-05-02 DIAGNOSIS — F39 Unspecified mood [affective] disorder: Secondary | ICD-10-CM

## 2018-05-02 DIAGNOSIS — K592 Neurogenic bowel, not elsewhere classified: Secondary | ICD-10-CM

## 2018-05-02 DIAGNOSIS — R5383 Other fatigue: Secondary | ICD-10-CM

## 2018-05-02 DIAGNOSIS — N319 Neuromuscular dysfunction of bladder, unspecified: Secondary | ICD-10-CM | POA: Diagnosis not present

## 2018-05-02 MED ORDER — CITRACAL PLUS PO TABS: ORAL_TABLET | ORAL | 0 refills | Status: AC

## 2018-05-02 NOTE — Patient Instructions (Addendum)
Please return in 12 months for your annual complete physical; please come fasting. Please schedule your Annual wellness visit.  Medicare recommends an Annual Wellness Visit for all patients. Please schedule this to be done with our Nurse Educator, Maudie Mercury. This is an informative "talk" visit; it's goals are to ensure that your health care needs are being met and to give you education regarding avoiding falls, ensuring you are not suffering from depression or problems with memory or thinking, and to educate you on Advance Care Planning. It helps me take good care of you!  I will release your lab results to you on your MyChart account with further instructions. Please reply with any questions.   I recommend the Cologuard test for your colon cancer screening that is due. I have ordered this test for you. [facility] will soon contact you to verify your insurance, address etc. They will then send you the kit; follow the instructions in the kit and return the kit to Cologuard. They will run the test and send the results to me. I will then give you the results. If this test is negative, we recommend repeating a colon cancer screening test in 3 years. If it is positive, I will refer you to a Gastroenterologist so you can get set up for the recommended colonoscopy.  Thank you!   If you have any questions or concerns, please don't hesitate to send me a message via MyChart or call the office at [phone]. Thank you for visiting with [place] today! It's our pleasure caring for you.  Denosumab injection What is this medicine? DENOSUMAB (den oh sue mab) slows bone breakdown. Prolia is used to treat osteoporosis in women after menopause and in men, and in people who are taking corticosteroids for 6 months or more. Delton See is used to treat a high calcium level due to cancer and to prevent bone fractures and other bone problems caused by multiple myeloma or cancer bone metastases. Delton See is also used to treat giant  cell tumor of the bone. This medicine may be used for other purposes; ask your health care provider or pharmacist if you have questions. COMMON BRAND NAME(S): Prolia, XGEVA What should I tell my health care provider before I take this medicine? They need to know if you have any of these conditions: -dental disease -having surgery or tooth extraction -infection -kidney disease -low levels of calcium or Vitamin D in the blood -malnutrition -on hemodialysis -skin conditions or sensitivity -thyroid or parathyroid disease -an unusual reaction to denosumab, other medicines, foods, dyes, or preservatives -pregnant or trying to get pregnant -breast-feeding How should I use this medicine? This medicine is for injection under the skin. It is given by a health care professional in a hospital or clinic setting. A special MedGuide will be given to you before each treatment. Be sure to read this information carefully each time. For Prolia, talk to your pediatrician regarding the use of this medicine in children. Special care may be needed. For Delton See, talk to your pediatrician regarding the use of this medicine in children. While this drug may be prescribed for children as young as 13 years for selected conditions, precautions do apply. Overdosage: If you think you have taken too much of this medicine contact a poison control center or emergency room at once. NOTE: This medicine is only for you. Do not share this medicine with others. What if I miss a dose? It is important not to miss your dose. Call your doctor or  health care professional if you are unable to keep an appointment. What may interact with this medicine? Do not take this medicine with any of the following medications: -other medicines containing denosumab This medicine may also interact with the following medications: -medicines that lower your chance of fighting infection -steroid medicines like prednisone or cortisone This list may  not describe all possible interactions. Give your health care provider a list of all the medicines, herbs, non-prescription drugs, or dietary supplements you use. Also tell them if you smoke, drink alcohol, or use illegal drugs. Some items may interact with your medicine. What should I watch for while using this medicine? Visit your doctor or health care professional for regular checks on your progress. Your doctor or health care professional may order blood tests and other tests to see how you are doing. Call your doctor or health care professional for advice if you get a fever, chills or sore throat, or other symptoms of a cold or flu. Do not treat yourself. This drug may decrease your body's ability to fight infection. Try to avoid being around people who are sick. You should make sure you get enough calcium and vitamin D while you are taking this medicine, unless your doctor tells you not to. Discuss the foods you eat and the vitamins you take with your health care professional. See your dentist regularly. Brush and floss your teeth as directed. Before you have any dental work done, tell your dentist you are receiving this medicine. Do not become pregnant while taking this medicine or for 5 months after stopping it. Talk with your doctor or health care professional about your birth control options while taking this medicine. Women should inform their doctor if they wish to become pregnant or think they might be pregnant. There is a potential for serious side effects to an unborn child. Talk to your health care professional or pharmacist for more information. What side effects may I notice from receiving this medicine? Side effects that you should report to your doctor or health care professional as soon as possible: -allergic reactions like skin rash, itching or hives, swelling of the face, lips, or tongue -bone pain -breathing problems -dizziness -jaw pain, especially after dental work -redness,  blistering, peeling of the skin -signs and symptoms of infection like fever or chills; cough; sore throat; pain or trouble passing urine -signs of low calcium like fast heartbeat, muscle cramps or muscle pain; pain, tingling, numbness in the hands or feet; seizures -unusual bleeding or bruising -unusually weak or tired Side effects that usually do not require medical attention (report to your doctor or health care professional if they continue or are bothersome): -constipation -diarrhea -headache -joint pain -loss of appetite -muscle pain -runny nose -tiredness -upset stomach This list may not describe all possible side effects. Call your doctor for medical advice about side effects. You may report side effects to FDA at 1-800-FDA-1088. Where should I keep my medicine? This medicine is only given in a clinic, doctor's office, or other health care setting and will not be stored at home. NOTE: This sheet is a summary. It may not cover all possible information. If you have questions about this medicine, talk to your doctor, pharmacist, or health care provider.  2019 Elsevier/Gold Standard (2017-07-02 16:10:44)
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
## 2018-05-03 LAB — COMPREHENSIVE METABOLIC PANEL
ALT: 13 U/L (ref 0–35)
AST: 19 U/L (ref 0–37)
Albumin: 4.5 g/dL (ref 3.5–5.2)
Alkaline Phosphatase: 65 U/L (ref 39–117)
BILIRUBIN TOTAL: 0.5 mg/dL (ref 0.2–1.2)
BUN: 19 mg/dL (ref 6–23)
CALCIUM: 9.6 mg/dL (ref 8.4–10.5)
CO2: 27 mEq/L (ref 19–32)
Chloride: 102 mEq/L (ref 96–112)
Creatinine, Ser: 0.7 mg/dL (ref 0.40–1.20)
GFR: 86.13 mL/min (ref >60.00)
Glucose, Bld: 82 mg/dL (ref 70–99)
Potassium: 4.4 mEq/L (ref 3.5–5.1)
Sodium: 138 mEq/L (ref 135–145)
Total Protein: 7.4 g/dL (ref 6.0–8.3)

## 2018-05-03 LAB — CBC WITH DIFFERENTIAL/PLATELET
Basophils Absolute: 0.1 10*3/uL (ref 0.0–0.1)
Basophils Relative: 0.8 % (ref 0.0–3.0)
Eosinophils Absolute: 0.1 10*3/uL (ref 0.0–0.7)
Eosinophils Relative: 0.9 % (ref 0.0–5.0)
HCT: 39.5 % (ref 36.0–46.0)
Hemoglobin: 13.5 g/dL (ref 12.0–15.0)
LYMPHS PCT: 37 % (ref 12.0–46.0)
Lymphs Abs: 2.5 10*3/uL (ref 0.7–4.0)
MCHC: 34.2 g/dL (ref 30.0–36.0)
MCV: 96.3 fl (ref 78.0–100.0)
Monocytes Absolute: 0.3 10*3/uL (ref 0.1–1.0)
Monocytes Relative: 5.1 % (ref 3.0–12.0)
Neutro Abs: 3.8 10*3/uL (ref 1.4–7.7)
Neutrophils Relative %: 56.2 % (ref 43.0–77.0)
Platelets: 274 10*3/uL (ref 150.0–400.0)
RBC: 4.1 Mil/uL (ref 3.87–5.11)
RDW: 13.1 % (ref 11.5–15.5)
WBC: 6.7 10*3/uL (ref 4.0–10.5)

## 2018-05-03 LAB — VITAMIN D 25 HYDROXY (VIT D DEFICIENCY, FRACTURES): VITD: 28.64 ng/mL — ABNORMAL LOW (ref 30.00–100.00)

## 2018-05-03 LAB — TSH: TSH: 2.71 u[IU]/mL (ref 0.35–4.50)

## 2018-05-04 DIAGNOSIS — F419 Anxiety disorder, unspecified: Secondary | ICD-10-CM | POA: Diagnosis not present

## 2018-05-05 DIAGNOSIS — G8220 Paraplegia, unspecified: Secondary | ICD-10-CM | POA: Diagnosis not present

## 2018-05-16 ENCOUNTER — Encounter: Payer: Self-pay | Admitting: *Deleted

## 2018-05-18 DIAGNOSIS — F419 Anxiety disorder, unspecified: Secondary | ICD-10-CM | POA: Diagnosis not present

## 2018-05-20 ENCOUNTER — Telehealth: Payer: Self-pay

## 2018-05-20 NOTE — Telephone Encounter (Signed)
Cologuard ordered on 02-17-2018 has not been completed at this time. Looks like her primary care provider has re- ordered this for her.

## 2018-05-23 DIAGNOSIS — Z1212 Encounter for screening for malignant neoplasm of rectum: Secondary | ICD-10-CM | POA: Diagnosis not present

## 2018-05-23 DIAGNOSIS — Z1211 Encounter for screening for malignant neoplasm of colon: Secondary | ICD-10-CM | POA: Diagnosis not present

## 2018-05-23 LAB — COLOGUARD: COLOGUARD: POSITIVE — AB

## 2018-05-27 ENCOUNTER — Other Ambulatory Visit: Payer: Self-pay | Admitting: *Deleted

## 2018-05-27 DIAGNOSIS — R195 Other fecal abnormalities: Secondary | ICD-10-CM

## 2018-05-31 NOTE — Progress Notes (Signed)
Pt wants to have return call on this with Tiara. She was called this afternoon and missed the call and wants specifically to have Tiara return the call concerning her results. Questions on health concerns and dangers of having procedure down with COVID

## 2018-06-01 DIAGNOSIS — F419 Anxiety disorder, unspecified: Secondary | ICD-10-CM | POA: Diagnosis not present

## 2018-06-01 NOTE — Progress Notes (Signed)
LMOVM with my extension for pt to return call

## 2018-06-02 ENCOUNTER — Encounter: Payer: Self-pay | Admitting: *Deleted

## 2018-06-02 NOTE — Progress Notes (Signed)
My Chart message sent

## 2018-06-06 ENCOUNTER — Telehealth: Payer: Self-pay | Admitting: Gastroenterology

## 2018-06-06 NOTE — Telephone Encounter (Signed)
Patient is paraplegic and needs a hospital case  Dr Silverio Decamp can you look at your hospital schedule and decide when we should do this procedure

## 2018-06-06 NOTE — Telephone Encounter (Signed)
Discussed with Dr Carlean Purl, he recommend holding the case due to COVID 19, will plan to schedule to schedule in 4-6 weeks.

## 2018-06-06 NOTE — Telephone Encounter (Signed)
We can schedule her based on hospital availability and can switch the office schedule accordingly, shouldn't be a problem given I have office schedule currently unfilled. Thanks

## 2018-06-06 NOTE — Telephone Encounter (Signed)
Cologuard positive. Please schedule colonoscopy for evaluation and exclude any neoplastic lesion.  Please inform patient the results. Thanks

## 2018-06-06 NOTE — Telephone Encounter (Signed)
Ok Noted  I have put her in my list to get scheduled after COVID 19

## 2018-06-09 DIAGNOSIS — F419 Anxiety disorder, unspecified: Secondary | ICD-10-CM | POA: Diagnosis not present

## 2018-06-14 ENCOUNTER — Telehealth: Payer: Self-pay

## 2018-06-14 DIAGNOSIS — F419 Anxiety disorder, unspecified: Secondary | ICD-10-CM | POA: Diagnosis not present

## 2018-06-14 NOTE — Telephone Encounter (Signed)
Copied from [place] [id number]. Topic: Quick Communication - See Telephone Encounter >> Jun 13, 2018  4:44 PM Blase Mess A wrote: CRM for notification. See Telephone encounter for: 06/13/18.  Patient is calling to speak to [place] regarding her results.  CB- [phone] (M)

## 2018-06-14 NOTE — Telephone Encounter (Signed)
Called pt and LM for her to return call or send a Mychart message

## 2018-06-16 NOTE — Telephone Encounter (Signed)
Spoke with patient about positive cologuard. She is agreeable with referral to GI and will wait until they contact patient for appointment.

## 2018-06-21 DIAGNOSIS — F411 Generalized anxiety disorder: Secondary | ICD-10-CM | POA: Diagnosis not present

## 2018-06-22 DIAGNOSIS — F419 Anxiety disorder, unspecified: Secondary | ICD-10-CM | POA: Diagnosis not present

## 2018-07-06 DIAGNOSIS — F419 Anxiety disorder, unspecified: Secondary | ICD-10-CM | POA: Diagnosis not present

## 2018-07-20 DIAGNOSIS — F419 Anxiety disorder, unspecified: Secondary | ICD-10-CM | POA: Diagnosis not present

## 2018-08-03 DIAGNOSIS — F419 Anxiety disorder, unspecified: Secondary | ICD-10-CM | POA: Diagnosis not present

## 2018-08-18 DIAGNOSIS — F419 Anxiety disorder, unspecified: Secondary | ICD-10-CM | POA: Diagnosis not present

## 2018-08-19 ENCOUNTER — Other Ambulatory Visit: Payer: Self-pay | Admitting: Family Medicine

## 2018-08-29 ENCOUNTER — Telehealth: Payer: Self-pay | Admitting: Family Medicine

## 2018-08-29 ENCOUNTER — Other Ambulatory Visit: Payer: Self-pay | Admitting: *Deleted

## 2018-08-29 MED ORDER — OXYBUTYNIN CHLORIDE 5 MG PO TABS: 5.0000 mg | ORAL_TABLET | Freq: Three times a day (TID) | ORAL | 0 refills | Status: DC

## 2018-08-29 NOTE — Telephone Encounter (Signed)
LMOVM that RX sent to [facility]

## 2018-08-29 NOTE — Telephone Encounter (Signed)
Pt is waiting on oxybutynin 5 mg [facility]. Pt would like 10 day#30 oxybutynin  supply [facility]. Pt is out

## 2018-08-29 NOTE — Telephone Encounter (Signed)
See request °

## 2018-08-31 DIAGNOSIS — F419 Anxiety disorder, unspecified: Secondary | ICD-10-CM | POA: Diagnosis not present

## 2018-09-14 DIAGNOSIS — F419 Anxiety disorder, unspecified: Secondary | ICD-10-CM | POA: Diagnosis not present

## 2018-09-28 DIAGNOSIS — F419 Anxiety disorder, unspecified: Secondary | ICD-10-CM | POA: Diagnosis not present

## 2018-10-03 DIAGNOSIS — F411 Generalized anxiety disorder: Secondary | ICD-10-CM | POA: Diagnosis not present

## 2018-10-10 ENCOUNTER — Encounter: Payer: Self-pay | Admitting: Family Medicine

## 2018-10-10 ENCOUNTER — Ambulatory Visit (INDEPENDENT_AMBULATORY_CARE_PROVIDER_SITE_OTHER): Payer: Medicare Other | Admitting: Family Medicine

## 2018-10-10 DIAGNOSIS — R195 Other fecal abnormalities: Secondary | ICD-10-CM

## 2018-10-10 DIAGNOSIS — F419 Anxiety disorder, unspecified: Secondary | ICD-10-CM

## 2018-10-10 DIAGNOSIS — Z87828 Personal history of other (healed) physical injury and trauma: Secondary | ICD-10-CM | POA: Diagnosis not present

## 2018-10-10 DIAGNOSIS — G8220 Paraplegia, unspecified: Secondary | ICD-10-CM | POA: Diagnosis not present

## 2018-10-10 DIAGNOSIS — F39 Unspecified mood [affective] disorder: Secondary | ICD-10-CM

## 2018-10-10 DIAGNOSIS — N319 Neuromuscular dysfunction of bladder, unspecified: Secondary | ICD-10-CM

## 2018-10-10 DIAGNOSIS — K592 Neurogenic bowel, not elsewhere classified: Secondary | ICD-10-CM | POA: Diagnosis not present
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
## 2018-10-11 ENCOUNTER — Telehealth: Payer: Self-pay

## 2018-10-11 ENCOUNTER — Encounter: Payer: Self-pay | Admitting: *Deleted

## 2018-10-11 NOTE — Telephone Encounter (Signed)
Insurance verification shows patient would be responsible for 20% co-insurance for Prolia (approx $220 out of pocket).  She has already met her $198 deductible.

## 2018-10-11 NOTE — Telephone Encounter (Signed)
Pt aware via MyChart, awaiting decision

## 2018-10-19 DIAGNOSIS — F419 Anxiety disorder, unspecified: Secondary | ICD-10-CM | POA: Diagnosis not present

## 2018-10-26 DIAGNOSIS — F419 Anxiety disorder, unspecified: Secondary | ICD-10-CM | POA: Diagnosis not present

## 2018-10-27 DIAGNOSIS — Z1231 Encounter for screening mammogram for malignant neoplasm of breast: Secondary | ICD-10-CM | POA: Diagnosis not present

## 2018-11-10 DIAGNOSIS — F419 Anxiety disorder, unspecified: Secondary | ICD-10-CM | POA: Diagnosis not present

## 2018-11-15 ENCOUNTER — Other Ambulatory Visit: Payer: Self-pay | Admitting: *Deleted

## 2018-11-15 DIAGNOSIS — R195 Other fecal abnormalities: Secondary | ICD-10-CM

## 2018-11-15 NOTE — Telephone Encounter (Signed)
Called patient to make aware that she is scheduled for a hospital colonoscopy on 01/03/2019 at 9:30am also COVID screening on 12/30/2018 at [facility] is scheduled on 12/06/2018 at 1 pm   No answer from pt and MAILBOX was full

## 2018-11-15 NOTE — Telephone Encounter (Signed)
Spoke with Dr Silverio Decamp and her first available hos is 10/27

## 2018-11-15 NOTE — Telephone Encounter (Signed)
Catherine Oneal are you working on this one?

## 2018-11-15 NOTE — Telephone Encounter (Signed)
Yes, we cannot do the procedure at [facility] if patient needs assistance as we don't have the equipment or staff

## 2018-11-15 NOTE — Telephone Encounter (Signed)
Pt is asking why she cannot have colonoscopy in [place]  Best call back [phone]. Would like to get procedure scheduled.
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
## 2018-11-15 NOTE — Telephone Encounter (Signed)
Due to recent COVID-19 restrictions implemented by our local and state authorities and in an effort to keep both patients and staff as safe as possible, our hospital system now requires COVID-19 testing prior to any scheduled hospital procedure. Please go to our [facility] location drive thru testing site ([address], [place], [place] [address]) on 12/30/2018 at  11am. There will be multiple testing areas, the first checkpoint being for pre-procedure/surgery testing. Get into the right (yellow) lane that leads to the PAT testing team. You will not be billed at the time of testing but may receive a bill later depending on your insurance. The approximate cost of the test is $100. You must agree to quarantine from the time of your testing until the procedure date on 01/03/2019 . This should include staying at home with ONLY the people you live with. Avoid take-out, grocery store shopping or leaving the house for any non-emergent reason. Failure to have your COVID-19 test done on the date and time you have been scheduled will result in cancellation of procedure. Please call our office at [phone] if you have any questions.

## 2018-11-15 NOTE — Telephone Encounter (Addendum)
Pos cologard needs to be scheduled for 10/27 it scheduled for 9:30am

## 2018-11-22 DIAGNOSIS — F419 Anxiety disorder, unspecified: Secondary | ICD-10-CM | POA: Diagnosis not present

## 2018-11-24 DIAGNOSIS — N1330 Unspecified hydronephrosis: Secondary | ICD-10-CM | POA: Diagnosis not present

## 2018-11-24 DIAGNOSIS — N319 Neuromuscular dysfunction of bladder, unspecified: Secondary | ICD-10-CM | POA: Diagnosis not present

## 2018-12-02 ENCOUNTER — Telehealth: Payer: Self-pay | Admitting: Gastroenterology

## 2018-12-02 NOTE — Telephone Encounter (Signed)
Pt was scheduled for a hospital colon and was not aware of appointment.  She stated that she is having symptoms and would like to discuss.

## 2018-12-06 ENCOUNTER — Encounter: Payer: Self-pay | Admitting: Family Medicine

## 2018-12-06 DIAGNOSIS — R928 Other abnormal and inconclusive findings on diagnostic imaging of breast: Secondary | ICD-10-CM | POA: Diagnosis not present

## 2018-12-06 DIAGNOSIS — R921 Mammographic calcification found on diagnostic imaging of breast: Secondary | ICD-10-CM | POA: Diagnosis not present

## 2018-12-06 DIAGNOSIS — N6311 Unspecified lump in the right breast, upper outer quadrant: Secondary | ICD-10-CM | POA: Diagnosis not present

## 2018-12-06 LAB — HM MAMMOGRAPHY

## 2018-12-07 ENCOUNTER — Encounter: Payer: Self-pay | Admitting: Family Medicine

## 2018-12-07 DIAGNOSIS — N3941 Urge incontinence: Secondary | ICD-10-CM | POA: Diagnosis not present

## 2018-12-07 DIAGNOSIS — N319 Neuromuscular dysfunction of bladder, unspecified: Secondary | ICD-10-CM | POA: Diagnosis not present

## 2018-12-09 DIAGNOSIS — F419 Anxiety disorder, unspecified: Secondary | ICD-10-CM | POA: Diagnosis not present

## 2018-12-12 ENCOUNTER — Telehealth: Payer: Self-pay | Admitting: Gastroenterology

## 2018-12-12 ENCOUNTER — Telehealth: Payer: Self-pay | Admitting: *Deleted

## 2018-12-12 NOTE — Telephone Encounter (Signed)
Left a message to call. I do not think there are any openings unless someone has cancelled. Dr Silverio Decamp does not have any hospital time after 01/03/19 until December.

## 2018-12-12 NOTE — Telephone Encounter (Signed)
Pt. Stated "I am on the 3rd floor talking to the nurse".

## 2018-12-15 ENCOUNTER — Telehealth: Payer: Self-pay | Admitting: Gastroenterology

## 2018-12-15 DIAGNOSIS — R195 Other fecal abnormalities: Secondary | ICD-10-CM

## 2018-12-15 NOTE — Telephone Encounter (Signed)
Spoke with the patient. She will see Dr Silverio Decamp on 12/30/18 in office and receive the instruction for her prep. She will go for COVID testing after the appointment. Her hospital colonoscopy is on 01/03/19.

## 2018-12-15 NOTE — Telephone Encounter (Signed)
This patient had a positive Cologard test earlier this year. Chronic constipation.  She is paraplegic. She is presently scheduled for her colonoscopy on 01/03/19 at [facility]. She canceled her pre-visit 12/12/18. She thought she was seeing you. She requests an office visit before the procedure. She has concerns. I have offered an appointment with an APP. She has seen Nicoletta Ba, PAC in the past. She declines. I put her on the office schedule 12/30/18. She needs prep instructions  She would have to go directly for COVID screening afterwards in order to be able to keep the 01/03/19 procedure date.

## 2018-12-22 NOTE — Telephone Encounter (Signed)
Patient has an office appointment next week to discuss

## 2018-12-27 DIAGNOSIS — F411 Generalized anxiety disorder: Secondary | ICD-10-CM | POA: Diagnosis not present

## 2018-12-28 DIAGNOSIS — F419 Anxiety disorder, unspecified: Secondary | ICD-10-CM | POA: Diagnosis not present

## 2018-12-29 ENCOUNTER — Telehealth: Payer: Self-pay | Admitting: Gastroenterology

## 2018-12-29 NOTE — Telephone Encounter (Signed)
Left message to call back  

## 2018-12-29 NOTE — Telephone Encounter (Signed)
Pt requested to speak to you regarding upcoming procedure at [facility].

## 2018-12-30 ENCOUNTER — Other Ambulatory Visit (HOSPITAL_COMMUNITY): Payer: Medicare Other

## 2018-12-30 ENCOUNTER — Other Ambulatory Visit (HOSPITAL_COMMUNITY)
Admission: RE | Admit: 2018-12-30 | Discharge: 2018-12-30 | Disposition: A | Discharge status: Home / Self Care | Payer: Medicare Other | Source: Ambulatory Visit | Attending: Gastroenterology | Admitting: Gastroenterology

## 2018-12-30 ENCOUNTER — Other Ambulatory Visit: Payer: Self-pay

## 2018-12-30 ENCOUNTER — Ambulatory Visit (INDEPENDENT_AMBULATORY_CARE_PROVIDER_SITE_OTHER): Payer: Medicare Other | Admitting: Gastroenterology

## 2018-12-30 ENCOUNTER — Encounter: Payer: Self-pay | Admitting: Gastroenterology

## 2018-12-30 VITALS — BP 96/70 | HR 88 | Temp 98.0°F

## 2018-12-30 DIAGNOSIS — K592 Neurogenic bowel, not elsewhere classified: Secondary | ICD-10-CM | POA: Diagnosis not present

## 2018-12-30 DIAGNOSIS — K5902 Outlet dysfunction constipation: Secondary | ICD-10-CM | POA: Diagnosis not present

## 2018-12-30 DIAGNOSIS — K5901 Slow transit constipation: Secondary | ICD-10-CM | POA: Diagnosis not present

## 2018-12-30 DIAGNOSIS — R195 Other fecal abnormalities: Secondary | ICD-10-CM | POA: Diagnosis not present

## 2018-12-30 DIAGNOSIS — G8220 Paraplegia, unspecified: Secondary | ICD-10-CM | POA: Diagnosis not present

## 2018-12-30 DIAGNOSIS — Z20828 Contact with and (suspected) exposure to other viral communicable diseases: Secondary | ICD-10-CM | POA: Diagnosis not present

## 2018-12-30 DIAGNOSIS — Z01812 Encounter for preprocedural laboratory examination: Secondary | ICD-10-CM | POA: Insufficient documentation

## 2018-12-30 NOTE — H&P (View-Only) (Signed)
Catherine Oneal    [id number]    April 14, 1960  Primary Care Physician:Andy, Karie Fetch, MD  Referring Physician: Leamon Arnt, [place],  [place] [address]   Chief complaint:  Positive cologuard HPI:  58 year old female with spinal cord injury, paraplegic with neurogenic bowel and bladder. She has chronic constipation and does digital stimulation to initiate bowel movement.  She has intermittent bright red blood per rectum after digital stimulation on her finger. Denies any nausea, vomiting, abdominal pain, melena or weight loss. No family history of colon cancer  Colonoscopy July 2008 was normal.  Patient did Cologuard for colorectal cancer screening, was positive.  She has canceled and rescheduled that point for colonoscopy multiple times.  She is requesting to reschedule the appointment that is already scheduled for next week as she does not want to miss a neighborhood party that she got invited to this weekend.  Outpatient Encounter Medications as of 12/30/2018  Medication Sig  . busPIRone (BUSPAR) 7.5 MG tablet   . Multiple Minerals-Vitamins (CITRACAL PLUS) TABS tid  . Multiple Vitamin (MULTI-VITAMINS) TABS Take by mouth.  . oxybutynin (DITROPAN) 5 MG tablet Take 1 tablet (5 mg total) by mouth 3 (three) times daily.  . sertraline (ZOLOFT) 100 MG tablet Take 100 mg by mouth.  . ziprasidone (GEODON) 20 MG capsule Take 20 mg by mouth 2 (two) times daily with a meal.  . zolpidem (AMBIEN) 10 MG tablet Take 10 mg by mouth as needed.   . [DISCONTINUED] docusate sodium (COLACE) 100 MG capsule Take by mouth daily.   No facility-administered encounter medications on file as of 12/30/2018.     Allergies as of 12/30/2018  . (No Known Allergies)    Past Medical History:  Diagnosis Date  . Anxiety   . Helicobacter pylori (H. pylori) infection   . Paraplegia ([place])   . Rectal incontinence   . Spinal injury     Past Surgical History:   Procedure Laterality Date  . BACK SURGERY    . BREATH TEK H PYLORI N/A 12/09/2012   Procedure: BREATH TEK H PYLORI;  Surgeon: Lafayette Dragon, MD;  Location: [facility];  Service: Endoscopy;  Laterality: N/A;  . ESOPHAGOGASTRODUODENOSCOPY N/A 10/06/2012   Procedure: ESOPHAGOGASTRODUODENOSCOPY (EGD);  Surgeon: Lafayette Dragon, MD;  Location: [facility];  Service: Endoscopy;  Laterality: N/A;  patient is paraplegic  . UPPER GASTROINTESTINAL ENDOSCOPY      Family History  Problem Relation Age of Onset  . Breast cancer Paternal Aunt   . Diabetes Maternal Grandmother   . Diabetes Father   . Colon polyps Mother   . Diabetes Mother   . Colon cancer Neg Hx   . Esophageal cancer Neg Hx   . Stomach cancer Neg Hx   . Rectal cancer Neg Hx     Social History   Socioeconomic History  . Marital status: Single    Spouse name: Not on file  . Number of children: 0  . Years of education: Not on file  . Highest education level: Not on file  Occupational History  . Occupation: Disabled  . Occupation: Scientist, research (medical), Freight forwarder  Social Needs  . Financial resource strain: Not on file  . Food insecurity    Worry: Not on file    Inability: Not on file  . Transportation needs    Medical: Not on file    Non-medical: Not on file  Tobacco Use  .  Smoking status: Never Smoker  . Smokeless tobacco: Never Used  Substance and Sexual Activity  . Alcohol use: Yes    Comment: occasional  . Drug use: No  . Sexual activity: Never  Lifestyle  . Physical activity    Days per week: Not on file    Minutes per session: Not on file  . Stress: Not on file  Relationships  . Social Herbalist on phone: Not on file    Gets together: Not on file    Attends religious service: Not on file    Active member of club or organization: Not on file    Attends meetings of clubs or organizations: Not on file    Relationship status: Not on file  . Intimate partner violence    Fear of current or ex partner:  Not on file    Emotionally abused: Not on file    Physically abused: Not on file    Forced sexual activity: Not on file  Other Topics Concern  . Not on file  Social History Narrative  . Not on file      Review of systems: Review of Systems  Constitutional: Negative for fever and chills.  HENT: Negative.   Eyes: Negative for blurred vision.  Respiratory: Negative for cough, shortness of breath and wheezing.   Cardiovascular: Negative for chest pain and palpitations.  Gastrointestinal: as per HPI Genitourinary: Negative for dysuria, urgency, frequency and hematuria.  Musculoskeletal: Negative for myalgias, back pain and joint pain.  Skin: Negative for itching and rash.  Neurological: Paraplegic Endo/Heme/Allergies: Negative Psychiatric/Behavioral: Negative for depression, suicidal ideas and hallucinations.  Positive for anxiety All other systems reviewed and are negative.   Physical Exam: Vitals:   12/30/18 1019  BP: 96/70  Pulse: 88  Temp: 98 F (36.7 C)   There is no height or weight on file to calculate BMI. Gen:      No acute distress, paraplegic in wheelchair HEENT:  EOMI, sclera anicteric Lungs:    Clear to auscultation bilaterally; normal respiratory effort CV:         Regular rate and rhythm; no murmurs Abd:      + bowel sounds; soft, non-tender; no palpable masses, no distension Neuro: alert and oriented x 3 Psych: normal mood and affect  Data Reviewed:  Reviewed labs, radiology imaging, old records and pertinent past GI work up   Assessment and Plan/Recommendations:  58 year old female with history of spinal cord injury, paraplegic with neurogenic bladder and bowel, anxiety disorder, chronic constipation  Chronic constipation secondary to slow transit colon and outlet dysfunction: Continue digital rectal stimulation Continue high-fiber diet and increase fluid intake  Positive Cologuard: She is already scheduled for colonoscopy for evaluation, next  week at [facility] Encouraged patient to keep the appointment The risks and benefits as well as alternatives of endoscopic procedure(s) have been discussed and reviewed. All questions answered. The patient agrees to proceed.  Small-volume bright red blood per rectum likely secondary to hemorrhoids or trauma to anorectum during digital stimulation, will further evaluate during colonoscopy   K. Denzil Magnuson , MD    CC: Leamon Arnt, MD

## 2018-12-30 NOTE — Patient Instructions (Signed)
Due to recent COVID-19 restrictions implemented by our local and state authorities and in an effort to keep both patients and staff as safe as possible, our hospital system now requires COVID-19 testing prior to any scheduled hospital procedure. Please go to our [facility] location drive thru testing site ([address] [place], [place] [address]) on 10/23 at  12pm. There will be multiple testing areas, the first checkpoint being for pre-procedure/surgery testing. Get into the right (yellow) lane that leads to the PAT testing team. You will not be billed at the time of testing but may receive a bill later depending on your insurance. The approximate cost of the test is $100. You must agree to quarantine from the time of your testing until the procedure date on 10/27. This should include staying at home with ONLY the people you live with. Avoid take-out, grocery store shopping or leaving the house for any non-emergent reason. Failure to have your COVID-19 test done on the date and time you have been scheduled will result in cancellation of procedure. Please call our office at [phone] if you have any questions.    You have been scheduled for your colonoscopy at [facility], separate instructions have been given  I appreciate the  opportunity to care for you  Thank You   Harl Bowie , MD

## 2018-12-30 NOTE — Telephone Encounter (Signed)
Patient left me a voicemail. She wanted to ask if she could COVID test on 01/02/19 instead of 12/30/18 as scheduled.  Also states she has had messages from what she thinks is Pre-Admissions office with questions about her medication list and "some other things" that she states she is not comfortable discussing over the phone. I called back to the patient and got her voicemail. Left as much information as I could explaining that the testing is timed to be certain the results are available when she arrives for her procedure. Advised she go as she is scheduled to avoid possible  cancellation of the colonoscopy.  As to the Pre-Admissions requirements, I encourage she call them back and voice her concerns. There may be alternates but I do not know what the policies or certification guidelines require.

## 2018-12-30 NOTE — Progress Notes (Signed)
Catherine Oneal    [id number]    08-03-1960  Primary Care Physician:Andy, Karie Fetch, MD  Referring Physician: Leamon Arnt, [place],  [place] [address]   Chief complaint:  Positive cologuard HPI:  58 year old female with spinal cord injury, paraplegic with neurogenic bowel and bladder. She has chronic constipation and does digital stimulation to initiate bowel movement.  She has intermittent bright red blood per rectum after digital stimulation on her finger. Denies any nausea, vomiting, abdominal pain, melena or weight loss. No family history of colon cancer  Colonoscopy July 2008 was normal.  Patient did Cologuard for colorectal cancer screening, was positive.  She has canceled and rescheduled that point for colonoscopy multiple times.  She is requesting to reschedule the appointment that is already scheduled for next week as she does not want to miss a neighborhood party that she got invited to this weekend.  Outpatient Encounter Medications as of 12/30/2018  Medication Sig  . busPIRone (BUSPAR) 7.5 MG tablet   . Multiple Minerals-Vitamins (CITRACAL PLUS) TABS tid  . Multiple Vitamin (MULTI-VITAMINS) TABS Take by mouth.  . oxybutynin (DITROPAN) 5 MG tablet Take 1 tablet (5 mg total) by mouth 3 (three) times daily.  . sertraline (ZOLOFT) 100 MG tablet Take 100 mg by mouth.  . ziprasidone (GEODON) 20 MG capsule Take 20 mg by mouth 2 (two) times daily with a meal.  . zolpidem (AMBIEN) 10 MG tablet Take 10 mg by mouth as needed.   . [DISCONTINUED] docusate sodium (COLACE) 100 MG capsule Take by mouth daily.   No facility-administered encounter medications on file as of 12/30/2018.     Allergies as of 12/30/2018  . (No Known Allergies)    Past Medical History:  Diagnosis Date  . Anxiety   . Helicobacter pylori (H. pylori) infection   . Paraplegia ([place])   . Rectal incontinence   . Spinal injury     Past Surgical History:   Procedure Laterality Date  . BACK SURGERY    . BREATH TEK H PYLORI N/A 12/09/2012   Procedure: BREATH TEK H PYLORI;  Surgeon: Lafayette Dragon, MD;  Location: [facility];  Service: Endoscopy;  Laterality: N/A;  . ESOPHAGOGASTRODUODENOSCOPY N/A 10/06/2012   Procedure: ESOPHAGOGASTRODUODENOSCOPY (EGD);  Surgeon: Lafayette Dragon, MD;  Location: [facility];  Service: Endoscopy;  Laterality: N/A;  patient is paraplegic  . UPPER GASTROINTESTINAL ENDOSCOPY      Family History  Problem Relation Age of Onset  . Breast cancer Paternal Aunt   . Diabetes Maternal Grandmother   . Diabetes Father   . Colon polyps Mother   . Diabetes Mother   . Colon cancer Neg Hx   . Esophageal cancer Neg Hx   . Stomach cancer Neg Hx   . Rectal cancer Neg Hx     Social History   Socioeconomic History  . Marital status: Single    Spouse name: Not on file  . Number of children: 0  . Years of education: Not on file  . Highest education level: Not on file  Occupational History  . Occupation: Disabled  . Occupation: Scientist, research (medical), Freight forwarder  Social Needs  . Financial resource strain: Not on file  . Food insecurity    Worry: Not on file    Inability: Not on file  . Transportation needs    Medical: Not on file    Non-medical: Not on file  Tobacco Use  .  Smoking status: Never Smoker  . Smokeless tobacco: Never Used  Substance and Sexual Activity  . Alcohol use: Yes    Comment: occasional  . Drug use: No  . Sexual activity: Never  Lifestyle  . Physical activity    Days per week: Not on file    Minutes per session: Not on file  . Stress: Not on file  Relationships  . Social Herbalist on phone: Not on file    Gets together: Not on file    Attends religious service: Not on file    Active member of club or organization: Not on file    Attends meetings of clubs or organizations: Not on file    Relationship status: Not on file  . Intimate partner violence    Fear of current or ex partner:  Not on file    Emotionally abused: Not on file    Physically abused: Not on file    Forced sexual activity: Not on file  Other Topics Concern  . Not on file  Social History Narrative  . Not on file      Review of systems: Review of Systems  Constitutional: Negative for fever and chills.  HENT: Negative.   Eyes: Negative for blurred vision.  Respiratory: Negative for cough, shortness of breath and wheezing.   Cardiovascular: Negative for chest pain and palpitations.  Gastrointestinal: as per HPI Genitourinary: Negative for dysuria, urgency, frequency and hematuria.  Musculoskeletal: Negative for myalgias, back pain and joint pain.  Skin: Negative for itching and rash.  Neurological: Paraplegic Endo/Heme/Allergies: Negative Psychiatric/Behavioral: Negative for depression, suicidal ideas and hallucinations.  Positive for anxiety All other systems reviewed and are negative.   Physical Exam: Vitals:   12/30/18 1019  BP: 96/70  Pulse: 88  Temp: 98 F (36.7 C)   There is no height or weight on file to calculate BMI. Gen:      No acute distress, paraplegic in wheelchair HEENT:  EOMI, sclera anicteric Lungs:    Clear to auscultation bilaterally; normal respiratory effort CV:         Regular rate and rhythm; no murmurs Abd:      + bowel sounds; soft, non-tender; no palpable masses, no distension Neuro: alert and oriented x 3 Psych: normal mood and affect  Data Reviewed:  Reviewed labs, radiology imaging, old records and pertinent past GI work up   Assessment and Plan/Recommendations:  58 year old female with history of spinal cord injury, paraplegic with neurogenic bladder and bowel, anxiety disorder, chronic constipation  Chronic constipation secondary to slow transit colon and outlet dysfunction: Continue digital rectal stimulation Continue high-fiber diet and increase fluid intake  Positive Cologuard: She is already scheduled for colonoscopy for evaluation, next  week at [facility] Encouraged patient to keep the appointment The risks and benefits as well as alternatives of endoscopic procedure(s) have been discussed and reviewed. All questions answered. The patient agrees to proceed.  Small-volume bright red blood per rectum likely secondary to hemorrhoids or trauma to anorectum during digital stimulation, will further evaluate during colonoscopy   K. Denzil Magnuson , MD    CC: Leamon Arnt, MD

## 2019-01-02 LAB — NOVEL CORONAVIRUS, NAA (HOSP ORDER, SEND-OUT TO REF LAB; TAT 18-24 HRS): SARS-CoV-2, NAA: NOT DETECTED

## 2019-01-02 NOTE — Progress Notes (Signed)
Attempted - no answer

## 2019-01-03 ENCOUNTER — Other Ambulatory Visit: Payer: Self-pay

## 2019-01-03 ENCOUNTER — Ambulatory Visit (HOSPITAL_COMMUNITY): Payer: Medicare Other | Admitting: Certified Registered Nurse Anesthetist

## 2019-01-03 ENCOUNTER — Encounter (HOSPITAL_COMMUNITY)
Admission: RE | Disposition: A | Discharge status: Home / Self Care | Payer: Self-pay | Source: Home / Self Care | Attending: Gastroenterology

## 2019-01-03 ENCOUNTER — Encounter: Payer: Self-pay | Admitting: Gastroenterology

## 2019-01-03 ENCOUNTER — Ambulatory Visit (HOSPITAL_COMMUNITY)
Admission: RE | Admit: 2019-01-03 | Discharge: 2019-01-03 | Disposition: A | Discharge status: Home / Self Care | Payer: Medicare Other | Attending: Gastroenterology | Admitting: Gastroenterology

## 2019-01-03 ENCOUNTER — Telehealth: Payer: Self-pay | Admitting: Gastroenterology

## 2019-01-03 ENCOUNTER — Encounter (HOSPITAL_COMMUNITY): Payer: Self-pay | Admitting: Gastroenterology

## 2019-01-03 DIAGNOSIS — Z79899 Other long term (current) drug therapy: Secondary | ICD-10-CM | POA: Insufficient documentation

## 2019-01-03 DIAGNOSIS — R195 Other fecal abnormalities: Secondary | ICD-10-CM | POA: Diagnosis not present

## 2019-01-03 DIAGNOSIS — E559 Vitamin D deficiency, unspecified: Secondary | ICD-10-CM | POA: Diagnosis not present

## 2019-01-03 DIAGNOSIS — N319 Neuromuscular dysfunction of bladder, unspecified: Secondary | ICD-10-CM | POA: Diagnosis not present

## 2019-01-03 DIAGNOSIS — G8220 Paraplegia, unspecified: Secondary | ICD-10-CM | POA: Insufficient documentation

## 2019-01-03 DIAGNOSIS — K5909 Other constipation: Secondary | ICD-10-CM | POA: Insufficient documentation

## 2019-01-03 DIAGNOSIS — F419 Anxiety disorder, unspecified: Secondary | ICD-10-CM | POA: Diagnosis not present

## 2019-01-03 DIAGNOSIS — Z1159 Encounter for screening for other viral diseases: Secondary | ICD-10-CM

## 2019-01-03 DIAGNOSIS — M810 Age-related osteoporosis without current pathological fracture: Secondary | ICD-10-CM | POA: Diagnosis not present

## 2019-01-03 HISTORY — PX: COLONOSCOPY WITH PROPOFOL: SHX5780

## 2019-01-03 SURGERY — COLONOSCOPY WITH PROPOFOL
Anesthesia: Monitor Anesthesia Care

## 2019-01-03 MED ORDER — ONDANSETRON HCL 4 MG/2ML IJ SOLN
INTRAMUSCULAR | Status: DC | PRN
  Administered 2019-01-03: 4 mg via INTRAVENOUS

## 2019-01-03 MED ORDER — PROPOFOL 500 MG/50ML IV EMUL
INTRAVENOUS | Status: AC
  Filled 2019-01-03: qty 50

## 2019-01-03 MED ORDER — SODIUM CHLORIDE 0.9 % IV SOLN: INTRAVENOUS | Status: DC

## 2019-01-03 MED ORDER — MIDAZOLAM HCL 5 MG/5ML IJ SOLN
INTRAMUSCULAR | Status: DC | PRN
  Administered 2019-01-03: 1 mg via INTRAVENOUS

## 2019-01-03 MED ORDER — LACTATED RINGERS IV SOLN
INTRAVENOUS | Status: DC
  Administered 2019-01-03: 10:00:00 via INTRAVENOUS

## 2019-01-03 MED ORDER — MIDAZOLAM HCL 2 MG/2ML IJ SOLN
INTRAMUSCULAR | Status: AC
  Filled 2019-01-03: qty 2

## 2019-01-03 SURGICAL SUPPLY — 22 items

## 2019-01-03 NOTE — Telephone Encounter (Signed)
Pt stated that she needs to schedule another colonoscopy.  She inquired whether she will need another COVID-19 test if she stays self quarantines.

## 2019-01-03 NOTE — Telephone Encounter (Signed)
Spoke with the patient and she is now scheduled for 02/06/19. Prep instructions with [employer] 2 day prep at the front desk for the patient to pick up. She understands she must retest for COVID as per the current guidelines.

## 2019-01-03 NOTE — Interval H&P Note (Signed)
History and Physical Interval Note:  01/03/2019 9:33 AM  Catherine Oneal  has presented today for surgery, with the diagnosis of Positive cologard.  The various methods of treatment have been discussed with the patient and family. After consideration of risks, benefits and other options for treatment, the patient has consented to  Procedure(s): COLONOSCOPY WITH PROPOFOL (N/A) as a surgical intervention.  The patient's history has been reviewed, patient examined, no change in status, stable for surgery.  I have reviewed the patient's chart and labs.  Questions were answered to the patient's satisfaction.     Kavitha Nandigam

## 2019-01-03 NOTE — Op Note (Signed)
[facility] Patient Name: Catherine Oneal Procedure Date: 01/03/2019 MRN: [id number] Attending MD: Mauri Pole , MD Date of Birth: [birth year] CSN: [id number] Age: 58 Admit Type: Outpatient Procedure:                Colonoscopy Indications:              Positive Cologuard test Providers:                Mauri Pole, MD, Ashley Jacobs, RN, [name], Technician, Sidon Alday CRNA, CRNA Referring MD:              Medicines:                Monitored Anesthesia Care Complications:            No immediate complications. Estimated Blood Loss:     Estimated blood loss: none. Procedure:                Pre-Anesthesia Assessment:                           - Prior to the procedure, a History and Physical                            was performed, and patient medications and                            allergies were reviewed. The patient's tolerance of                            previous anesthesia was also reviewed. The risks                            and benefits of the procedure and the sedation                            options and risks were discussed with the patient.                            All questions were answered, and informed consent                            was obtained. Prior Anticoagulants: The patient has                            taken no previous anticoagulant or antiplatelet                            agents. ASA Grade Assessment: III - A patient with                            severe systemic disease. After reviewing the risks  and benefits, the patient was deemed in                            satisfactory condition to undergo the procedure.                           After obtaining informed consent, the colonoscope                            was passed under direct vision. Throughout the                            procedure, the patient's blood pressure, pulse, and                      oxygen saturations were monitored continuously. The                            [id number] Olympus pediatric colonscope                            was introduced through the anus and advanced to the                            the transverse colon. The colonoscopy was                            technically difficult and complex due to poor bowel                            prep with stool present. The patient tolerated the                            procedure well. The quality of the bowel                            preparation was poor. Scope In: 10:55:15 AM Scope Out: 11:05:43 AM Total Procedure Duration: 0 hours 10 minutes 28 seconds  Findings:      The perianal and digital rectal examinations were normal.      A moderate amount of semi-solid stool was found in the entire colon,       precluding visualization. Impression:               - Preparation of the colon was poor.                           - Stool in the entire examined colon.                           - No specimens collected. Moderate Sedation:      Not Applicable - Patient had care per Anesthesia. Recommendation:           - Patient has a contact number available for  emergencies. The signs and symptoms of potential                            delayed complications were discussed with the                            patient. Return to normal activities tomorrow.                            Written discharge instructions were provided to the                            patient.                           - Resume previous diet.                           - Continue present medications.                           - Repeat colonoscopy at the next available                            appointment because the bowel preparation was                            suboptimal.                           - For future colonoscopy the patient will require                            an extended  preparation. If there are any                            questions, please contact the gastroenterologist. Procedure Code(s):        --- Professional ---                           [phone], 53, Colonoscopy, flexible; diagnostic,                            including collection of specimen(s) by brushing or                            washing, when performed (separate procedure) Diagnosis Code(s):        --- Professional ---                           R19.5, Other fecal abnormalities CPT copyright 2019 American Medical Association. All rights reserved. The codes documented in this report are preliminary and upon coder review may  be revised to meet current compliance requirements. Mauri Pole, MD 01/03/2019 11:09:49 AM This report has been signed electronically. Number of Addenda: 0

## 2019-01-03 NOTE — Anesthesia Postprocedure Evaluation (Signed)
Anesthesia Post Note  Patient: Catherine Oneal  Procedure(s) Performed: COLONOSCOPY WITH PROPOFOL (N/A )     Patient location during evaluation: PACU Anesthesia Type: MAC Level of consciousness: awake and alert Pain management: pain level controlled Vital Signs Assessment: post-procedure vital signs reviewed and stable Respiratory status: spontaneous breathing, nonlabored ventilation, respiratory function stable and patient connected to nasal cannula oxygen Cardiovascular status: stable and blood pressure returned to baseline Postop Assessment: no apparent nausea or vomiting Anesthetic complications: no    Last Vitals:  Vitals:   01/03/19 1110 01/03/19 1120  BP: 113/65 121/80  Pulse: 67 67  Resp: 10 14  Temp: 36.4 C   SpO2: 100% 100%    Last Pain:  Vitals:   01/03/19 1120  TempSrc:   PainSc: 0-No pain                 Kealey Kemmer DAVID

## 2019-01-03 NOTE — Anesthesia Preprocedure Evaluation (Signed)
Anesthesia Evaluation  Patient identified by MRN, date of birth, ID band Patient awake    Reviewed: Allergy & Precautions, NPO status , Patient's Chart, lab work & pertinent test results  Airway Mallampati: I  TM Distance: >3 FB Neck ROM: Full    Dental   Pulmonary    Pulmonary exam normal        Cardiovascular Normal cardiovascular exam     Neuro/Psych Anxiety    GI/Hepatic   Endo/Other    Renal/GU      Musculoskeletal   Abdominal   Peds  Hematology   Anesthesia Other Findings   Reproductive/Obstetrics                             Anesthesia Physical Anesthesia Plan  ASA: III  Anesthesia Plan: MAC   Post-op Pain Management:    Induction: Intravenous  PONV Risk Score and Plan: 2 and Ondansetron  Airway Management Planned: Simple Face Mask  Additional Equipment:   Intra-op Plan:   Post-operative Plan:   Informed Consent: I have reviewed the patients History and Physical, chart, labs and discussed the procedure including the risks, benefits and alternatives for the proposed anesthesia with the patient or authorized representative who has indicated his/her understanding and acceptance.       Plan Discussed with: CRNA and Surgeon  Anesthesia Plan Comments:         Anesthesia Quick Evaluation

## 2019-01-03 NOTE — Discharge Instructions (Signed)

## 2019-01-03 NOTE — Transfer of Care (Signed)
Immediate Anesthesia Transfer of Care Note  Patient: Catherine Oneal  Procedure(s) Performed: COLONOSCOPY WITH PROPOFOL (N/A )  Patient Location: PACU  Anesthesia Type:MAC  Level of Consciousness: awake, alert  and oriented  Airway & Oxygen Therapy: Patient Spontanous Breathing and Patient connected to nasal cannula oxygen  Post-op Assessment: Report given to RN and Post -op Vital signs reviewed and stable  Post vital signs: Reviewed and stable  Last Vitals:  Vitals Value Taken Time  BP    Temp    Pulse    Resp    SpO2      Last Pain:  Vitals:   01/03/19 0951  TempSrc: Oral  PainSc: 0-No pain         Complications: No apparent anesthesia complications

## 2019-01-04 ENCOUNTER — Encounter (HOSPITAL_COMMUNITY): Payer: Self-pay | Admitting: Gastroenterology

## 2019-01-11 DIAGNOSIS — F419 Anxiety disorder, unspecified: Secondary | ICD-10-CM | POA: Diagnosis not present

## 2019-01-19 DIAGNOSIS — G8220 Paraplegia, unspecified: Secondary | ICD-10-CM | POA: Diagnosis not present

## 2019-01-19 DIAGNOSIS — R195 Other fecal abnormalities: Secondary | ICD-10-CM | POA: Diagnosis not present

## 2019-01-19 DIAGNOSIS — K625 Hemorrhage of anus and rectum: Secondary | ICD-10-CM | POA: Diagnosis not present

## 2019-01-19 DIAGNOSIS — K5900 Constipation, unspecified: Secondary | ICD-10-CM | POA: Diagnosis not present

## 2019-01-30 DIAGNOSIS — G8220 Paraplegia, unspecified: Secondary | ICD-10-CM | POA: Diagnosis not present

## 2019-01-31 DIAGNOSIS — Z23 Encounter for immunization: Secondary | ICD-10-CM | POA: Diagnosis not present

## 2019-01-31 NOTE — Progress Notes (Signed)
CALLED PATIENT VOICE MAIL FULL UNABLE TO LEAVE MESSAGE

## 2019-02-01 ENCOUNTER — Other Ambulatory Visit (HOSPITAL_COMMUNITY): Payer: Medicare Other

## 2019-02-01 ENCOUNTER — Other Ambulatory Visit: Payer: Self-pay

## 2019-02-01 DIAGNOSIS — R195 Other fecal abnormalities: Secondary | ICD-10-CM

## 2019-02-01 NOTE — Progress Notes (Signed)
Pre-op endo call attempted. No answer. Unable to lvmm as mailbox is full

## 2019-02-02 ENCOUNTER — Other Ambulatory Visit: Payer: Self-pay | Admitting: Family Medicine

## 2019-02-03 ENCOUNTER — Inpatient Hospital Stay (HOSPITAL_COMMUNITY)
Admission: RE | Admit: 2019-02-03 | Discharge: 2019-02-03 | Disposition: A | Discharge status: Home / Self Care | Payer: Medicare Other | Source: Ambulatory Visit | Attending: Gastroenterology | Admitting: Gastroenterology

## 2019-02-03 NOTE — Progress Notes (Signed)
Patient did not show for COVID test 02/03/2019.  Attempted to contact patient to reschedule COVID test appointment for Saturday 02/04/2019. Patient did not answer and voicemail box was full so unable to leave message.

## 2019-02-05 NOTE — Progress Notes (Signed)
Attempted to contact patient regarding her procedure tomorrow with dr. Silverio Decamp. The patient did not get her covid test.  I left a message on a voice mail that had her name clearly stated . I told the patient to not drink her colon prep that she needed to have her procedure rescheduled and to call dr. Woodward Ku office at [phone] to reschedule. I clearly stated that she should not come to [facility] hospital tomorrow that her procedure needed to be rescheduled.

## 2019-02-06 ENCOUNTER — Encounter (HOSPITAL_COMMUNITY): Admission: RE | Payer: Self-pay | Source: Home / Self Care

## 2019-02-06 ENCOUNTER — Telehealth: Payer: Self-pay | Admitting: *Deleted

## 2019-02-06 ENCOUNTER — Ambulatory Visit (HOSPITAL_COMMUNITY): Admission: RE | Admit: 2019-02-06 | Payer: Medicare Other | Source: Home / Self Care | Admitting: Gastroenterology

## 2019-02-06 SURGERY — COLONOSCOPY WITH PROPOFOL
Anesthesia: Monitor Anesthesia Care

## 2019-02-07 ENCOUNTER — Telehealth: Payer: Self-pay | Admitting: *Deleted

## 2019-02-07 NOTE — Telephone Encounter (Signed)
We have Jan 12th open for [facility] if patient wants to reschedule

## 2019-02-07 NOTE — Telephone Encounter (Signed)
See other phone note

## 2019-02-13 DIAGNOSIS — N319 Neuromuscular dysfunction of bladder, unspecified: Secondary | ICD-10-CM | POA: Diagnosis not present

## 2019-02-22 ENCOUNTER — Telehealth: Payer: Self-pay | Admitting: Family Medicine

## 2019-02-22 ENCOUNTER — Encounter: Payer: Self-pay | Admitting: Family Medicine

## 2019-02-22 NOTE — Telephone Encounter (Signed)
I called the patient to schedule AWV-S with Loma Sousa (Last AWV 06/05/15).  She said that she was just about to send a message to Dr. Jonni Sanger in my chart, so she will schedule AWV at the same time as office visit if Dr. Jonni Sanger wants her to come in.

## 2019-03-15 DIAGNOSIS — F419 Anxiety disorder, unspecified: Secondary | ICD-10-CM | POA: Diagnosis not present

## 2019-03-15 NOTE — Telephone Encounter (Signed)
Feb 23 rd is next available hospital, 1-12 th full

## 2019-03-29 ENCOUNTER — Encounter: Payer: Self-pay | Admitting: Family Medicine

## 2019-03-29 DIAGNOSIS — F419 Anxiety disorder, unspecified: Secondary | ICD-10-CM | POA: Diagnosis not present

## 2019-03-30 ENCOUNTER — Telehealth: Payer: Self-pay

## 2019-03-30 NOTE — Telephone Encounter (Signed)
Need to reverify for prolia. LVM for patient to return call.

## 2019-04-03 ENCOUNTER — Telehealth: Payer: Self-pay | Admitting: Family Medicine

## 2019-04-03 NOTE — Telephone Encounter (Signed)
See open message

## 2019-04-03 NOTE — Telephone Encounter (Signed)
Called patient gave info and sent in my chart for her records

## 2019-04-03 NOTE — Telephone Encounter (Signed)
Patient called in returning Jasmine's call and patient would like to know what was going on.

## 2019-04-03 NOTE — Telephone Encounter (Signed)
Patient had a question for Dr.Andy, asking if she needed to be tested for covid, if she was exposed to someone who was positive but did not tell her until last week after their 14 day quarantine. Catherine Oneal also wanted to know with her spinal cord injury if she would be eligible for the covid vaccine and/or if it is safe for her.

## 2019-04-03 NOTE — Telephone Encounter (Signed)
No need for covid testing now since 30 days since exposure.   She is not yet currently eligible to get vaccinated. She will be in the next phase due to her comorbidities. Can past below infor to her mychart if she'd like.   Thank you for your inquiry about COVID-19 vaccination. For up-to-date information about vaccination in [facility] please visit [email or web address] or [email or web address]  A vaccine will be available to all who want it, but right now vaccine supply is very limited. There is not enough vaccine supply for everyone to get vaccinated right away. The federal government decides how many COVID-19 vaccines each state gets based on the state's population of people ages 20 and up and notifies our state each week of how much vaccine we will receive.  As of 03/14/2018, the following groups are being vaccinated:  Health care workers critical to caring for and working directly with patients with [place], including staff responsible for cleaning and maintenance in those area. This includes outpatient providers such as those who are focused on COVID-19 patient evaluation, respiratory care (e.g., respiratory diagnostic testing centers), members of a dedicated respiratory care team, or those frequently involved in COVID-19 testing sites.   Health care workers administering vaccine in initial vaccine phases   Long-term care staff and residents--people in skilled nursing facilities and in adult, family and group homes   Anyone 71 years or older, regardless of health status or living situation   Based on new federal recommendations issued on February 28, 2019 by the [employer] on [employer], [place] has updated and simplified the Scientist, forensic. [place]'s updated phases are as follows:  - Phase 1a: Health care workers fighting  [place] staff and residents. - Phase 1b (to begin in early January): Adults 103 years or older and frontline essential workers - Phase 2: Adults at high risk for exposure and at increased risk of severe illness. - Phase 3: Students - Phase 4: Everyone who wants a safe and effective COVID-19 vaccination

## 2019-04-03 NOTE — Telephone Encounter (Signed)
Patient has dentist app this week wanted to know if she should be worried had been almost a month from exposure. She has not had any symptoms. She also wanted to know if with her history where she falls in the scheduling.

## 2019-04-11 DIAGNOSIS — F411 Generalized anxiety disorder: Secondary | ICD-10-CM | POA: Diagnosis not present

## 2019-04-12 DIAGNOSIS — F419 Anxiety disorder, unspecified: Secondary | ICD-10-CM | POA: Diagnosis not present

## 2019-04-26 DIAGNOSIS — F419 Anxiety disorder, unspecified: Secondary | ICD-10-CM | POA: Diagnosis not present

## 2019-05-10 DIAGNOSIS — F419 Anxiety disorder, unspecified: Secondary | ICD-10-CM | POA: Diagnosis not present

## 2019-05-24 DIAGNOSIS — F419 Anxiety disorder, unspecified: Secondary | ICD-10-CM | POA: Diagnosis not present

## 2019-05-24 DIAGNOSIS — K625 Hemorrhage of anus and rectum: Secondary | ICD-10-CM | POA: Diagnosis not present

## 2019-05-24 DIAGNOSIS — K5900 Constipation, unspecified: Secondary | ICD-10-CM | POA: Diagnosis not present

## 2019-05-24 DIAGNOSIS — R195 Other fecal abnormalities: Secondary | ICD-10-CM | POA: Diagnosis not present

## 2019-05-25 ENCOUNTER — Encounter: Payer: Self-pay | Admitting: Family Medicine

## 2019-06-07 DIAGNOSIS — F419 Anxiety disorder, unspecified: Secondary | ICD-10-CM | POA: Diagnosis not present

## 2019-06-19 DIAGNOSIS — F419 Anxiety disorder, unspecified: Secondary | ICD-10-CM | POA: Diagnosis not present

## 2019-06-23 DIAGNOSIS — K592 Neurogenic bowel, not elsewhere classified: Secondary | ICD-10-CM | POA: Diagnosis not present

## 2019-06-23 DIAGNOSIS — K635 Polyp of colon: Secondary | ICD-10-CM | POA: Diagnosis not present

## 2019-06-23 DIAGNOSIS — N319 Neuromuscular dysfunction of bladder, unspecified: Secondary | ICD-10-CM | POA: Diagnosis not present

## 2019-06-23 DIAGNOSIS — D122 Benign neoplasm of ascending colon: Secondary | ICD-10-CM | POA: Diagnosis not present

## 2019-06-23 DIAGNOSIS — Z1211 Encounter for screening for malignant neoplasm of colon: Secondary | ICD-10-CM | POA: Diagnosis not present

## 2019-06-23 DIAGNOSIS — Z538 Procedure and treatment not carried out for other reasons: Secondary | ICD-10-CM | POA: Diagnosis not present

## 2019-06-23 DIAGNOSIS — G8220 Paraplegia, unspecified: Secondary | ICD-10-CM | POA: Diagnosis not present

## 2019-06-23 DIAGNOSIS — K648 Other hemorrhoids: Secondary | ICD-10-CM | POA: Diagnosis not present

## 2019-06-23 DIAGNOSIS — R195 Other fecal abnormalities: Secondary | ICD-10-CM | POA: Diagnosis not present

## 2019-06-23 DIAGNOSIS — K5909 Other constipation: Secondary | ICD-10-CM | POA: Diagnosis not present

## 2019-06-23 DIAGNOSIS — Z79899 Other long term (current) drug therapy: Secondary | ICD-10-CM | POA: Diagnosis not present

## 2019-07-04 ENCOUNTER — Telehealth: Payer: Self-pay | Admitting: Family Medicine

## 2019-07-04 NOTE — Telephone Encounter (Signed)
Left message for patient to call back and schedule Medicare Annual Wellness Visit (AWV) either virtually/audio only OR in office. Whatever the patients preference is.  No hx; please schedule at anytime with [facility]Nurse Health Advisor at [facility].  STARTED MCR IN 2007

## 2019-07-05 DIAGNOSIS — F419 Anxiety disorder, unspecified: Secondary | ICD-10-CM | POA: Diagnosis not present

## 2019-07-18 DIAGNOSIS — F411 Generalized anxiety disorder: Secondary | ICD-10-CM | POA: Diagnosis not present

## 2019-08-17 ENCOUNTER — Ambulatory Visit: Payer: Medicare Other | Admitting: Family Medicine

## 2019-08-17 DIAGNOSIS — Z0289 Encounter for other administrative examinations: Secondary | ICD-10-CM

## 2019-08-18 ENCOUNTER — Encounter: Payer: Self-pay | Admitting: Family Medicine

## 2019-08-23 ENCOUNTER — Encounter: Payer: Self-pay | Admitting: Family Medicine

## 2019-08-28 ENCOUNTER — Ambulatory Visit (INDEPENDENT_AMBULATORY_CARE_PROVIDER_SITE_OTHER): Payer: Medicare Other | Admitting: Family Medicine

## 2019-08-28 ENCOUNTER — Encounter: Payer: Self-pay | Admitting: Family Medicine

## 2019-08-28 ENCOUNTER — Other Ambulatory Visit: Payer: Self-pay

## 2019-08-28 VITALS — BP 100/72 | HR 85 | Temp 98.6°F | Resp 14 | Ht 69.0 in | Wt 135.0 lb

## 2019-08-28 DIAGNOSIS — D2372 Other benign neoplasm of skin of left lower limb, including hip: Secondary | ICD-10-CM

## 2019-08-28 DIAGNOSIS — L821 Other seborrheic keratosis: Secondary | ICD-10-CM | POA: Diagnosis not present

## 2019-08-28 DIAGNOSIS — D229 Melanocytic nevi, unspecified: Secondary | ICD-10-CM

## 2019-08-28 NOTE — Patient Instructions (Signed)
Please return for your annual complete physical and pap smear; please come fasting. Next available.   I will release your lab results to you on your MyChart account with further instructions. Please reply with any questions.   If you have any questions or concerns, please don't hesitate to send me a message via MyChart or call the office at [phone]. Thank you for visiting with [place] today! It's our pleasure caring for you.   Skin Biopsy, Care After This sheet gives you information about how to care for yourself after your procedure. Your health care provider may also give you more specific instructions. If you have problems or questions, contact your health care provider. What can I expect after the procedure? After the procedure, it is common to have:  Soreness.  Bruising.  Itching. Follow these instructions at home: Biopsy site care Follow instructions from your health care provider about how to take care of your biopsy site. Make sure you:  Wash your hands with soap and water before and after you change your bandage (dressing). If soap and water are not available, use hand sanitizer.  Apply ointment on your biopsy site as directed by your health care provider.  Change your dressing as told by your health care provider.  Leave stitches (sutures), skin glue, or adhesive strips in place. These skin closures may need to stay in place for 2 weeks or longer. If adhesive strip edges start to loosen and curl up, you may trim the loose edges. Do not remove adhesive strips completely unless your health care provider tells you to do that.  If the biopsy area bleeds, apply gentle pressure for 10 minutes. Check your biopsy site every day for signs of infection. Check for:  Redness, swelling, or pain.  Fluid or blood.  Warmth.  Pus or a bad smell.  General instructions  Rest and then return to your normal activities as told by your health care provider.  Take over-the-counter and  prescription medicines only as told by your health care provider.  Keep all follow-up visits as told by your health care provider. This is important. Contact a health care provider if:  You have redness, swelling, or pain around your biopsy site.  You have fluid or blood coming from your biopsy site.  Your biopsy site feels warm to the touch.  You have pus or a bad smell coming from your biopsy site.  You have a fever.  Your sutures, skin glue, or adhesive strips loosen or come off sooner than expected. Get help right away if:  You have bleeding that does not stop with pressure or a dressing. Summary  After the procedure, it is common to have soreness, bruising, and itching at the site.  Follow instructions from your health care provider about how to take care of your biopsy site.  Check your biopsy site every day for signs of infection.  Contact a health care provider if you have redness, swelling, or pain around your biopsy site, or your biopsy site feels warm to the touch.  Keep all follow-up visits as told by your health care provider. This is important. This information is not intended to replace advice given to you by your health care provider. Make sure you discuss any questions you have with your health care provider. Document Revised: 08/23/2017 Document Reviewed: 08/23/2017 Elsevier Patient Education  [name].

## 2019-08-28 NOTE — Progress Notes (Signed)
Subjective  CC:  Chief Complaint  Patient presents with   Abrasion    black groth on left thigh x 1 week.     HPI: Catherine Oneal is a 59 y.o. female who presents to the office today to address the problems listed above in the chief complaint.  Mole check: noted dark growing mole on left upper thigh about 1-2 weeks ago. Growing.  No bleeding or pain. Raised. Has a few other areas she wants checked as well.  Reviewed colonoscopy reports.   HM: due for cpe.   Assessment  1. Atypical mole   2. Seborrheic keratoses      Plan   Atypical mole:  Due to fast growth, and dark color, send for path. Routine post procedure care instructions were given to patient in detail.  Reassured: seb K's and lentigos are benign.  Follow up: cpe  12/29/2019  No orders of the defined types were placed in this encounter.  No orders of the defined types were placed in this encounter.     I reviewed the patients updated PMH, FH, and SocHx.    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   oxybutynin (DITROPAN) 5 MG tablet TAKE 1 TABLET BY MOUTH 3  TIMES DAILY   sertraline (ZOLOFT) 100 MG tablet Take 100 mg by mouth.   ziprasidone (GEODON) 20 MG capsule Take 20 mg by mouth 2 (two) times daily with a meal.    Allergies: Patient has  No Known Allergies. Family History: Patient family history includes Breast cancer in her paternal aunt; Colon polyps in her mother; Diabetes in her father, maternal grandmother, and mother. Social History:  Patient  reports that she has never smoked. She has never used smokeless tobacco. She reports current alcohol use. She reports that she does not use drugs.  Review of Systems: Constitutional: Negative for fever malaise or anorexia Cardiovascular: negative for chest pain Respiratory: negative for SOB or persistent cough Gastrointestinal: negative for abdominal pain  Objective  Vitals: BP 100/72    Pulse 85    Temp 98.6 F (37 C) (Temporal)    Resp 14    Ht 5\' 9"  (1.753 m) Comment: per chart.   Wt 135 lb (61.2 kg) Comment: per pt   SpO2 97%    BMI 19.94 kg/m  General: no acute distress , A&Ox3 Skin:  Warm, no rashes. Multiple lentigos, a seb k on left abd wall. Left ant thigh with 2-70mm dark raised mole  Shave Biopsy Procedure Note  Pre-operative Diagnosis: Dysplastic nevi  Post-operative Diagnosis: atypical possible dysplatic nevi  Locations:right thigh  Indications: atypical in appearance and fast growing  Anesthesia: Lidocaine 1% with epinephrine   Procedure Details  History of allergy to iodine: no  Patient informed of the risks (including bleeding and infection) and benefits of the  procedure and Verbal informed consent obtained.  The lesion and surrounding area were given a sterile prep using alcohol. A flexible razor was used to shave biopsy the lesion on the right ant thigh. Hemostasis achieved with silver nitrate. Antibiotic ointment and a sterile dressing applied.  The specimen was sent for pathologic examination. The patient tolerated the procedure well.  No complications.     Commons side effects, risks, benefits, and alternatives for medications and treatment plan prescribed today were discussed, and the patient expressed understanding of the given  instructions. Patient is instructed to call or message via MyChart if he/she has any questions or concerns regarding our treatment plan. No barriers to understanding were identified. We discussed Red Flag symptoms and signs in detail. Patient expressed understanding regarding what to do in case of urgent or emergency type symptoms.   Medication list was reconciled, printed and provided to the patient in AVS. Patient instructions and summary information was reviewed with the patient as documented in the AVS. This note was prepared with assistance of Dragon voice recognition software. Occasional wrong-word or sound-a-like substitutions may have occurred due to the inherent limitations of voice recognition software  This visit occurred during the SARS-CoV-2 public health emergency.  Safety protocols were in place, including screening questions prior to the visit, additional usage of staff PPE, and extensive cleaning of exam room while observing appropriate contact time as indicated for disinfecting solutions.

## 2019-10-10 DIAGNOSIS — F411 Generalized anxiety disorder: Secondary | ICD-10-CM | POA: Diagnosis not present

## 2019-10-19 DIAGNOSIS — N319 Neuromuscular dysfunction of bladder, unspecified: Secondary | ICD-10-CM | POA: Diagnosis not present

## 2019-12-29 ENCOUNTER — Other Ambulatory Visit: Payer: Self-pay

## 2019-12-29 ENCOUNTER — Encounter: Payer: Self-pay | Admitting: Family Medicine

## 2019-12-29 ENCOUNTER — Ambulatory Visit (INDEPENDENT_AMBULATORY_CARE_PROVIDER_SITE_OTHER): Payer: Medicare Other | Admitting: Family Medicine

## 2019-12-29 VITALS — BP 98/68 | HR 92 | Temp 97.9°F | Resp 15 | Wt 133.0 lb

## 2019-12-29 DIAGNOSIS — N3941 Urge incontinence: Secondary | ICD-10-CM | POA: Diagnosis not present

## 2019-12-29 DIAGNOSIS — Z87828 Personal history of other (healed) physical injury and trauma: Secondary | ICD-10-CM

## 2019-12-29 DIAGNOSIS — E559 Vitamin D deficiency, unspecified: Secondary | ICD-10-CM

## 2019-12-29 DIAGNOSIS — K592 Neurogenic bowel, not elsewhere classified: Secondary | ICD-10-CM

## 2019-12-29 DIAGNOSIS — G8220 Paraplegia, unspecified: Secondary | ICD-10-CM

## 2019-12-29 DIAGNOSIS — R339 Retention of urine, unspecified: Secondary | ICD-10-CM | POA: Diagnosis not present

## 2019-12-29 DIAGNOSIS — F39 Unspecified mood [affective] disorder: Secondary | ICD-10-CM | POA: Diagnosis not present

## 2019-12-29 DIAGNOSIS — N319 Neuromuscular dysfunction of bladder, unspecified: Secondary | ICD-10-CM | POA: Diagnosis not present

## 2019-12-29 DIAGNOSIS — Z23 Encounter for immunization: Secondary | ICD-10-CM

## 2019-12-29 DIAGNOSIS — M816 Localized osteoporosis [Lequesne]: Secondary | ICD-10-CM
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
## 2019-12-29 NOTE — Patient Instructions (Signed)
Please return in 12 months for your annual complete physical; please come fasting.  I will release your lab results to you on your MyChart account with further instructions. Please reply with any questions.  Today you were given your flu vaccination.   If you have any questions or concerns, please don't hesitate to send me a message via MyChart or call the office at [phone]. Thank you for visiting with [place] today! It's our pleasure caring for you.  Please see if there is another therapist in your psychiatry office or therapist's office that could see you while your regular therapist is out. If not: Please call [facility] to schedule an appointment with Dr. Trey Paula; she is a therapist here at our [place] office.  The phone number is: [phone]

## 2019-12-30 LAB — CBC WITH DIFFERENTIAL/PLATELET
Absolute Monocytes: 399 cells/uL (ref 200–950)
Basophils Absolute: 63 cells/uL (ref 0–200)
Basophils Relative: 0.9 %
Eosinophils Absolute: 70 cells/uL (ref 15–500)
Eosinophils Relative: 1 %
HCT: 41.1 % (ref 35.0–45.0)
Hemoglobin: 13.7 g/dL (ref 11.7–15.5)
Lymphs Abs: 2597 cells/uL (ref 850–3900)
MCH: 31.6 pg (ref 27.0–33.0)
MCHC: 33.3 g/dL (ref 32.0–36.0)
MCV: 94.7 fL (ref 80.0–100.0)
MPV: 9.8 fL (ref 7.5–12.5)
Monocytes Relative: 5.7 %
Neutro Abs: 3871 cells/uL (ref 1500–7800)
Neutrophils Relative %: 55.3 %
Platelets: 368 10*3/uL (ref 140–400)
RBC: 4.34 10*6/uL (ref 3.80–5.10)
RDW: 11.7 % (ref 11.0–15.0)
Total Lymphocyte: 37.1 %
WBC: 7 10*3/uL (ref 3.8–10.8)

## 2019-12-30 LAB — COMPLETE METABOLIC PANEL WITH GFR
AG Ratio: 1.5 (calc) (ref 1.0–2.5)
ALT: 18 U/L (ref 6–29)
AST: 20 U/L (ref 10–35)
Albumin: 4.4 g/dL (ref 3.6–5.1)
Alkaline phosphatase (APISO): 59 U/L (ref 37–153)
BUN: 17 mg/dL (ref 7–25)
CO2: 25 mmol/L (ref 20–32)
Calcium: 10.3 mg/dL (ref 8.6–10.4)
Chloride: 104 mmol/L (ref 98–110)
Creat: 0.77 mg/dL (ref 0.50–1.05)
GFR, Est African American: 98 mL/min/{1.73_m2} (ref >=60)
GFR, Est Non African American: 85 mL/min/{1.73_m2} (ref >=60)
Globulin: 3 g/dL (calc) (ref 1.9–3.7)
Glucose, Bld: 80 mg/dL (ref 65–99)
Potassium: 4.7 mmol/L (ref 3.5–5.3)
Sodium: 138 mmol/L (ref 135–146)
Total Bilirubin: 0.6 mg/dL (ref 0.2–1.2)
Total Protein: 7.4 g/dL (ref 6.1–8.1)

## 2019-12-30 LAB — VITAMIN D 25 HYDROXY (VIT D DEFICIENCY, FRACTURES): Vit D, 25-Hydroxy: 29 ng/mL — ABNORMAL LOW (ref 30–100)

## 2020-01-15 ENCOUNTER — Encounter: Payer: Self-pay | Admitting: Family Medicine

## 2020-01-15 ENCOUNTER — Other Ambulatory Visit: Payer: Self-pay | Admitting: Radiology

## 2020-01-15 DIAGNOSIS — N6031 Fibrosclerosis of right breast: Secondary | ICD-10-CM | POA: Diagnosis not present

## 2020-01-15 DIAGNOSIS — R921 Mammographic calcification found on diagnostic imaging of breast: Secondary | ICD-10-CM | POA: Diagnosis not present

## 2020-01-15 DIAGNOSIS — F411 Generalized anxiety disorder: Secondary | ICD-10-CM | POA: Diagnosis not present

## 2020-02-08 DIAGNOSIS — Z5189 Encounter for other specified aftercare: Secondary | ICD-10-CM | POA: Diagnosis not present

## 2020-02-08 DIAGNOSIS — G8220 Paraplegia, unspecified: Secondary | ICD-10-CM | POA: Diagnosis not present

## 2020-02-08 DIAGNOSIS — M2578 Osteophyte, vertebrae: Secondary | ICD-10-CM | POA: Diagnosis not present

## 2020-02-08 DIAGNOSIS — Z9889 Other specified postprocedural states: Secondary | ICD-10-CM | POA: Diagnosis not present

## 2020-02-08 DIAGNOSIS — M438X6 Other specified deforming dorsopathies, lumbar region: Secondary | ICD-10-CM | POA: Diagnosis not present

## 2020-02-23 ENCOUNTER — Other Ambulatory Visit: Payer: Self-pay | Admitting: Family Medicine

## 2020-04-09 DIAGNOSIS — F411 Generalized anxiety disorder: Secondary | ICD-10-CM | POA: Diagnosis not present

## 2020-05-06 ENCOUNTER — Telehealth: Payer: Self-pay | Admitting: Family Medicine

## 2020-05-06 NOTE — Telephone Encounter (Signed)
Left message for patient to call back and schedule Medicare Annual Wellness Visit (AWV) either virtually OR in office.   No hx; please schedule at anytime with [facility]Nurse Health Advisor at [facility].  This should be a 45 minute visit.   

## 2020-05-10 DIAGNOSIS — Z993 Dependence on wheelchair: Secondary | ICD-10-CM | POA: Diagnosis not present

## 2020-05-10 DIAGNOSIS — Z4689 Encounter for fitting and adjustment of other specified devices: Secondary | ICD-10-CM | POA: Diagnosis not present

## 2020-05-10 DIAGNOSIS — G8220 Paraplegia, unspecified: Secondary | ICD-10-CM | POA: Diagnosis not present

## 2020-05-31 ENCOUNTER — Telehealth: Payer: Self-pay

## 2020-05-31 NOTE — Telephone Encounter (Signed)
  LAST APPOINTMENT DATE: 12/2019   NEXT APPOINTMENT DATE:12/2020  MEDICATION: busPIRone (BUSPAR) 7.5 MG tablet  PHARMACY: [id number] - [place], [place] - [address] [place] [address] [place] [address]  Comments: Patient has only couple days left of medication  Please advise

## 2020-06-03 ENCOUNTER — Other Ambulatory Visit: Payer: Self-pay

## 2020-06-03 MED ORDER — BUSPIRONE HCL 7.5 MG PO TABS: 7.5000 mg | ORAL_TABLET | Freq: Two times a day (BID) | ORAL | 2 refills | Status: AC

## 2020-06-03 NOTE — Telephone Encounter (Signed)
Refill sent to pharmacy.   

## 2020-06-03 NOTE — Telephone Encounter (Signed)
Can order 7.5mg  bid; thanks

## 2020-06-03 NOTE — Telephone Encounter (Signed)
Please advise on sig, med has not been filled since 2019

## 2020-07-08 DIAGNOSIS — N3941 Urge incontinence: Secondary | ICD-10-CM | POA: Diagnosis not present

## 2020-07-09 DIAGNOSIS — F411 Generalized anxiety disorder: Secondary | ICD-10-CM | POA: Diagnosis not present

## 2020-07-26 DIAGNOSIS — N3941 Urge incontinence: Secondary | ICD-10-CM | POA: Diagnosis not present

## 2020-08-02 ENCOUNTER — Telehealth: Payer: Self-pay

## 2020-08-02 ENCOUNTER — Other Ambulatory Visit: Payer: Self-pay

## 2020-08-02 MED ORDER — OXYBUTYNIN CHLORIDE 5 MG PO TABS: 5.0000 mg | ORAL_TABLET | Freq: Three times a day (TID) | ORAL | 2 refills | Status: DC

## 2020-08-02 NOTE — Telephone Encounter (Signed)
MEDICATION: oxybutynin (DITROPAN) 5 MG tablet  PHARMACY: [facility]  Comments:   **Let patient know to contact pharmacy at the end of the day to make sure medication is ready. **  ** Please notify patient to allow 48-72 hours to process**  **Encourage patient to contact the pharmacy for refills or they can request refills through [facility]**

## 2020-08-02 NOTE — Telephone Encounter (Signed)
Has been filled.

## 2020-08-08 DIAGNOSIS — N319 Neuromuscular dysfunction of bladder, unspecified: Secondary | ICD-10-CM | POA: Diagnosis not present

## 2020-08-08 DIAGNOSIS — N3941 Urge incontinence: Secondary | ICD-10-CM | POA: Diagnosis not present

## 2020-08-19 ENCOUNTER — Telehealth: Payer: Self-pay | Admitting: Family Medicine

## 2020-08-19 NOTE — Progress Notes (Signed)
  Chronic Care Management   Outreach Note  08/19/2020 Name: Catherine Oneal MRN: [id number] DOB: 10/19/1960  Referred by: Leamon Arnt, MD Reason for referral : No chief complaint on file.   An unsuccessful telephone outreach was attempted today. The patient was referred to the pharmacist for assistance with care management and care coordination.   Follow Up Plan:   Lauretta Grill Upstream Scheduler

## 2020-08-26 ENCOUNTER — Telehealth: Payer: Self-pay

## 2020-08-26 NOTE — Telephone Encounter (Signed)
Please Advise

## 2020-08-26 NOTE — Telephone Encounter (Signed)
Spoke with patient, states she will go to UC to get it looked at. Was concerned because the areas were hot to the touch. Assured her Dr.Andy stated if area was warm and looked like infection UC could help

## 2020-08-26 NOTE — Telephone Encounter (Signed)
Patient called in stating that she had some extreme swelling in her leg and ankle over the weekend, hot to the touch. Swelling has gone down, but it looks a little red. Advised that we would need to schedule an appointment but patient wants to be seen today (no other offices have any openings), tried offering tomorrow at 1130 with Dr.Andy, declined saying that she has somewhere to be. Wanting advice on if she needs to go to [facility] or ED.

## 2020-08-27 ENCOUNTER — Emergency Department (HOSPITAL_BASED_OUTPATIENT_CLINIC_OR_DEPARTMENT_OTHER): Payer: Medicare Other

## 2020-08-27 ENCOUNTER — Emergency Department (HOSPITAL_BASED_OUTPATIENT_CLINIC_OR_DEPARTMENT_OTHER)
Admission: EM | Admit: 2020-08-27 | Discharge: 2020-08-27 | Disposition: A | Discharge status: Home / Self Care | Payer: Medicare Other | Attending: Emergency Medicine | Admitting: Emergency Medicine

## 2020-08-27 ENCOUNTER — Other Ambulatory Visit: Payer: Self-pay

## 2020-08-27 ENCOUNTER — Encounter (HOSPITAL_BASED_OUTPATIENT_CLINIC_OR_DEPARTMENT_OTHER): Payer: Self-pay | Admitting: Emergency Medicine

## 2020-08-27 DIAGNOSIS — M79661 Pain in right lower leg: Secondary | ICD-10-CM | POA: Diagnosis not present

## 2020-08-27 DIAGNOSIS — S8011XA Contusion of right lower leg, initial encounter: Secondary | ICD-10-CM | POA: Insufficient documentation

## 2020-08-27 DIAGNOSIS — X58XXXA Exposure to other specified factors, initial encounter: Secondary | ICD-10-CM | POA: Insufficient documentation

## 2020-08-27 DIAGNOSIS — M7989 Other specified soft tissue disorders: Secondary | ICD-10-CM | POA: Diagnosis not present

## 2020-08-27 DIAGNOSIS — M79604 Pain in right leg: Secondary | ICD-10-CM

## 2020-08-27 DIAGNOSIS — S8991XA Unspecified injury of right lower leg, initial encounter: Secondary | ICD-10-CM | POA: Diagnosis not present

## 2020-08-27 DIAGNOSIS — L97911 Non-pressure chronic ulcer of unspecified part of right lower leg limited to breakdown of skin: Secondary | ICD-10-CM | POA: Diagnosis not present

## 2020-08-27 DIAGNOSIS — T148XXA Other injury of unspecified body region, initial encounter: Secondary | ICD-10-CM

## 2020-08-27 NOTE — Discharge Instructions (Addendum)
1.  You had a ultrasound of the leg that does not show any blood clot.  X-ray does not show any areas of bony fracture 2.  Your examination is very suggestive of a large bruise that is spreading out and starting to resolve. 3.  Keep your leg elevated as much as possible.  Apply well wrapped ice pack.  Continue to observe for any changes. 4.  Return if you develop worsening redness, fever, general illness or other concerning symptoms. 5.  Have your doctor recheck the leg within the next 2 to 3 days.

## 2020-08-27 NOTE — ED Triage Notes (Signed)
Pt arrives to ED with c/o of right leg/knee swelling x4 days. Pt reports no known injury to leg or knee. Pt reports the leg is red and hot to the touch. Pt has spinal cord injury and is paraplegic so unable to report any pain in leg.

## 2020-08-27 NOTE — ED Provider Notes (Signed)
[facility] EMERGENCY DEPT Provider Note   CSN: [id number] Arrival date & time: 08/27/20  1807     History Chief Complaint  Patient presents with   Leg Swelling    Catherine Oneal is a 60 y.o. female.  HPI Patient reports she has had swelling of the right lower leg and ankle for 4 days.  She reports she became concerned because some family members and friends saw it  and were worried about a possible blood clot.  Patient reports that initially there was a large, swollen area to the outside of the lower leg just below the knee.  She reports her also seem to be more swelling down close to the ankle.  She has been trying to elevate the leg.  She reports now there is more discoloration and the swelling is spread out to include more of the lower leg.  Patient is paraplegic.  She reports she does not perceive pain.  She denies injury and reports that swelling started before an incident where she did slowly slip out of her wheelchair.  She reports she had dropped something and ended up able to brace herself and ease out of the chair and then get back into it.  She was not aware of any significant injury to her leg.    Past Medical History:  Diagnosis Date   Anxiety    Helicobacter pylori (H. 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   RECTAL INCONTINENCE 07/02/2007   History of spinal cord injury 07/02/2007    Past Surgical History:  Procedure Laterality Date   [place]  N/A 12/09/2012   Procedure: BREATH TEK Kandis Ban;  Surgeon: Lafayette Dragon, MD;  Location: [facility];  Service: Endoscopy;  Laterality: N/A;   COLONOSCOPY WITH PROPOFOL N/A 01/03/2019   Procedure: COLONOSCOPY WITH PROPOFOL;  Surgeon: Mauri Pole, MD;  Location: [facility];  Service: Endoscopy;  Laterality: N/A;   ESOPHAGOGASTRODUODENOSCOPY N/A 10/06/2012   Procedure: ESOPHAGOGASTRODUODENOSCOPY (EGD);  Surgeon: Lafayette Dragon, MD;  Location: [facility];  Service: Endoscopy;  Laterality: N/A;  patient is paraplegic   UPPER GASTROINTESTINAL ENDOSCOPY       OB History   No obstetric history on file.     Family History  Problem Relation Age of Onset   Breast cancer Paternal Aunt    Diabetes Maternal Grandmother    Diabetes Father    Colon polyps Mother    Diabetes Mother    Colon cancer Neg Hx    Esophageal cancer Neg Hx    Stomach cancer Neg Hx    Rectal cancer Neg Hx     Social History   Tobacco Use   Smoking status: Never   Smokeless tobacco: Never  Vaping Use   Vaping Use: Never used  Substance Use Topics   Alcohol use: Yes    Comment: occasional   Drug use: No    Home Medications Prior to Admission medications   Medication Sig Start  Date End Date Taking? Authorizing Provider  busPIRone (BUSPAR) 7.5 MG tablet Take 1 tablet (7.5 mg total) by mouth 2 (two) times daily. 06/03/20   Leamon Arnt, MD  Multiple Minerals-Vitamins (CITRACAL PLUS) TABS tid 05/02/18   Leamon Arnt, MD  Multiple Vitamin (MULTI-VITAMINS) TABS Take by mouth.    [provider]  oxybutynin (DITROPAN) 5 MG tablet Take 1 tablet (5 mg total) by mouth 3 (three) times daily. 08/02/20   Leamon Arnt, MD  sertraline (ZOLOFT) 100 MG tablet Take 100 mg by mouth. 11/17/16   [provider]  ziprasidone (GEODON) 20 MG capsule Take 20 mg by mouth 2 (two) times daily with a meal.    [provider]    Allergies    Patient has no known allergies.  Review of Systems    Review of Systems 10 systems reviewed and negative except as per HPI Physical Exam Updated Vital Signs BP (!) 145/85 (BP Location: Right Arm)   Pulse 81   Temp 98.7 F (37.1 C) (Oral)   Resp 18   Ht 5\' 9"  (1.753 m)   Wt 59 kg   SpO2 100%   BMI 19.20 kg/m   Physical Exam Constitutional:      Appearance: Normal appearance.  HENT:     Mouth/Throat:     Pharynx: Oropharynx is clear.  Eyes:     Extraocular Movements: Extraocular movements intact.  Cardiovascular:     Rate and Rhythm: Normal rate and regular rhythm.  Pulmonary:     Effort: Pulmonary effort is normal.     Breath sounds: Normal breath sounds.  Abdominal:     General: There is no distension.     Palpations: Abdomen is soft.     Tenderness: There is no abdominal tenderness.  Musculoskeletal:     Comments: Patient has muscular atrophy of the lower parts of the legs.  The right lower extremity has moderate swelling of the lateral tibial surface.  The knee itself does not have an effusion.  The patella is mobile.  The calf is soft.  Dorsalis pedis pulses are 2+ and symmetric.  See attached images  Neurological:     Mental Status: She is alert.         ED Results / Procedures / Treatments   Labs (all labs ordered are listed, but only abnormal results are displayed) Labs Reviewed - No data to display  EKG None  Radiology DG Tibia/Fibula Right  Result Date: 08/27/2020 CLINICAL DATA:  Swelling EXAM: RIGHT TIBIA AND FIBULA - 2 VIEW COMPARISON:  None. FINDINGS: No fracture or malalignment. Diffuse soft tissue edema. No radiopaque foreign body. IMPRESSION: No acute osseous abnormality Electronically Signed   By: Donavan Foil M.D.   On: 08/27/2020 21:27   US Venous Img Lower Right (DVT Study)  Result Date: 08/27/2020 CLINICAL DATA:  Right lower extremity in the redness and swelling. Injury. Paraplegic. Color changes and ulceration positive. EXAM: RIGHT LOWER EXTREMITY VENOUS DOPPLER ULTRASOUND TECHNIQUE:  Gray-scale sonography with compression, as well as color and duplex ultrasound, were performed to evaluate the deep venous system(s) from the level of the common femoral vein through the popliteal and proximal calf veins. COMPARISON:  None. FINDINGS: VENOUS Normal compressibility of the common femoral, superficial femoral, and popliteal veins, as well as the visualized calf veins. Visualized portions of profunda femoral vein and great saphenous vein unremarkable. No filling defects to suggest DVT on grayscale or color Doppler imaging. Doppler waveforms show normal direction of  venous flow, normal respiratory plasticity and response to augmentation. Limited views of the contralateral common femoral vein are unremarkable. OTHER None. Limitations: Ultrasound sonographer notes proximal lateral leg area of edema and bruising. IMPRESSION: No deep venous thrombosis of the right lower extremity. Electronically Signed   By: Iven Finn M.D.   On: 08/27/2020 22:03    Procedures Procedures   Medications Ordered in ED Medications - No data to display  ED Course  I have reviewed the triage vital signs and the nursing notes.  Pertinent labs & imaging results that were available during my care of the patient were reviewed by me and considered in my medical decision making (see chart for details).    MDM Rules/Calculators/A&P                         Patient presents with swelling of the right lower leg.  She does not recall a traumatic injury however, very close to the time the swelling initiated she did have an incident where she fell out of her wheelchair but was able to really brace and slow the transition to the ground.  The exam is very suggestive of a large resolving hematoma.  DVT study is negative.  X-rays negative.  Patient does not have an effusion at the knee.  She does note that at onset the area of prominent swelling was below the knee into the lateral aspect of the leg.  This time I recommendation  is for continued observation with elevating and icing. return precautions reviewed.  Final Clinical Impression(s) / ED Diagnoses Final diagnoses:  Pain and swelling of lower extremity, right  Hematoma    Rx / DC Orders ED Discharge Orders     None        Charlesetta Shanks, MD 08/27/20 2309

## 2020-08-28 ENCOUNTER — Ambulatory Visit: Payer: Medicare Other | Admitting: Family Medicine

## 2020-08-30 ENCOUNTER — Ambulatory Visit (INDEPENDENT_AMBULATORY_CARE_PROVIDER_SITE_OTHER): Payer: Medicare Other | Admitting: Family Medicine

## 2020-08-30 ENCOUNTER — Other Ambulatory Visit: Payer: Self-pay

## 2020-08-30 ENCOUNTER — Encounter: Payer: Self-pay | Admitting: Family Medicine

## 2020-08-30 VITALS — BP 94/64 | HR 89 | Temp 97.9°F | Ht 69.0 in | Wt 131.2 lb

## 2020-08-30 DIAGNOSIS — M79661 Pain in right lower leg: Secondary | ICD-10-CM

## 2020-08-30 DIAGNOSIS — S8011XD Contusion of right lower leg, subsequent encounter: Secondary | ICD-10-CM | POA: Diagnosis not present

## 2020-08-30 DIAGNOSIS — F39 Unspecified mood [affective] disorder: Secondary | ICD-10-CM | POA: Diagnosis not present

## 2020-08-30 DIAGNOSIS — N319 Neuromuscular dysfunction of bladder, unspecified: Secondary | ICD-10-CM | POA: Diagnosis not present

## 2020-08-30 DIAGNOSIS — M7989 Other specified soft tissue disorders: Secondary | ICD-10-CM | POA: Diagnosis not present

## 2020-08-30 DIAGNOSIS — G8220 Paraplegia, unspecified: Secondary | ICD-10-CM

## 2020-08-30 NOTE — Patient Instructions (Signed)
Please follow up as scheduled for your next visit with me: 12/30/2020 for your physical with blood work.   Elevate your leg when possible.   If you have any questions or concerns, please don't hesitate to send me a message via MyChart or call the office at [phone]. Thank you for visiting with [place] today! It's our pleasure caring for you.

## 2020-08-30 NOTE — Progress Notes (Signed)
Subjective  CC:  Chief Complaint  Patient presents with   Leg Swelling    Leg swelling has gone down, has a ER note    HPI: Catherine Oneal is a 60 y.o. female who presents to the office today to address the problems listed above in the chief complaint. Reviewed recent ER evaluation for right leg pain and swelling: neg US dopplers for DVT and neg xrays. Most c/w contusion/hematoma and swelling due to trauma from fall out of wheelchair or trauma when getting herself back up into the wheelchair. No new sxs. Skin changes are c/w healing ecchymosis. Swelling persists but is down considerably. No systemic sxs Of note, pt reports the ER drew blood but no labs are ordered or resulted. Mood is controlled and managed by psych.  Paraplegic with neurogenic bladder: seeing specialist at [facility] and had botox injections: now much better controlled w/o further incontinence.   Assessment  1. Pain and swelling of right lower leg   2. Contusion of right lower extremity, subsequent encounter   3. Paraplegia following spinal cord injury ([place])   4. Episodic mood disorder ([place])   5. Neurogenic bladder      Plan  Contusion/hematoma:  resolving. No sign of other problems. Neg ER eval. Time, elevation and it should resolve.  Paraplegia: she is stable. She is cautious.  Bladder much improved with botox injections. Mood is stable.   Follow up: for cpe  12/30/2020  No orders of the defined types were placed in this encounter.  No orders of the defined types were placed in this encounter.     I reviewed the patients updated PMH, FH, and SocHx.    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 (Patient taking differently: Take 7.5 mg by mouth daily.)   Multiple Minerals-Vitamins (CITRACAL PLUS) TABS tid   Multiple Vitamin (MULTI-VITAMINS) TABS Take by mouth.   sertraline (ZOLOFT) 100 MG tablet Take 100 mg by mouth.   ziprasidone (GEODON) 20 MG capsule Take 20 mg by mouth 2 (two) times daily with a meal.    Allergies: Patient has No Known Allergies. Family History: Patient family history includes Breast cancer in her paternal aunt; Colon polyps in her mother; Diabetes in her father, maternal grandmother, and mother. Social History:  Patient  reports that she has never smoked. She has never used smokeless tobacco. She reports current alcohol use. She reports that she does not use drugs.  Review of Systems: Constitutional: Negative for fever malaise or anorexia Cardiovascular: negative for chest pain Respiratory: negative for SOB or persistent cough Gastrointestinal: negative for abdominal pain  Objective  Vitals: BP 94/64   Pulse 89   Temp 97.9 F (36.6 C) (Temporal)   Ht 5\' 9"  (1.753 m)   Wt 131 lb 3.2 oz (59.5 kg)   SpO2 99%   BMI 19.37 kg/m  General: no acute distress , A&Ox3, comfortable in wheelchar  Respiratory:  Good breath sounds bilaterally, CTAB with normal respiratory effort Skin:  Warm, no rashes, right anterior leg with yellowing bruising and +1 pitting edema. No calf cords appreciated. No further hematoma    Commons side effects, risks, benefits, and alternatives for medications and treatment plan prescribed today were discussed, and the patient expressed understanding of the given instructions. Patient is instructed to call or message via MyChart if he/she has any  questions or concerns regarding our treatment plan. No barriers to understanding were identified. We discussed Red Flag symptoms and signs in detail. Patient expressed understanding regarding what to do in case of urgent or emergency type symptoms.  Medication list was reconciled, printed and provided to the patient in AVS. Patient instructions and summary information was reviewed with the patient as documented in the AVS. This note was prepared with assistance of Dragon voice recognition software. Occasional wrong-word or sound-a-like substitutions may have occurred due to the inherent limitations of voice recognition software  This visit occurred during the SARS-CoV-2 public health emergency.  Safety protocols were in place, including screening questions prior to the visit, additional usage of staff PPE, and extensive cleaning of exam room while observing appropriate contact time as indicated for disinfecting solutions.

## 2020-09-10 ENCOUNTER — Telehealth: Payer: Self-pay

## 2020-09-10 NOTE — Telephone Encounter (Signed)
Patient called in stating that she saw Dr Jonni Sanger last week for swelling in her leg. She stated that her swelling has not gotten any better and was wondering if Dr Jonni Sanger will call in a prescription that can help with swelling.

## 2020-09-10 NOTE — Telephone Encounter (Signed)
No mention of possible medications if no improvement mentioned at last visit. I believe patient should be seen again since no improvement. Please advise

## 2020-09-12 ENCOUNTER — Encounter: Payer: Self-pay | Admitting: Physician Assistant

## 2020-09-12 ENCOUNTER — Encounter: Payer: Self-pay | Admitting: Family Medicine

## 2020-09-12 ENCOUNTER — Other Ambulatory Visit: Payer: Self-pay

## 2020-09-12 ENCOUNTER — Ambulatory Visit (INDEPENDENT_AMBULATORY_CARE_PROVIDER_SITE_OTHER): Payer: Medicare Other | Admitting: Physician Assistant

## 2020-09-12 VITALS — BP 114/76 | HR 83 | Temp 98.2°F | Ht 69.0 in

## 2020-09-12 DIAGNOSIS — G8220 Paraplegia, unspecified: Secondary | ICD-10-CM | POA: Diagnosis not present

## 2020-09-12 DIAGNOSIS — B353 Tinea pedis: Secondary | ICD-10-CM | POA: Diagnosis not present

## 2020-09-12 DIAGNOSIS — M7989 Other specified soft tissue disorders: Secondary | ICD-10-CM

## 2020-09-12 DIAGNOSIS — M79661 Pain in right lower leg: Secondary | ICD-10-CM | POA: Diagnosis not present

## 2020-09-12 DIAGNOSIS — S8011XD Contusion of right lower leg, subsequent encounter: Secondary | ICD-10-CM

## 2020-09-12 NOTE — Patient Instructions (Signed)
Someone will call to schedule U/S for you tomorrow. Continue to elevate the leg and wear compression stocking.

## 2020-09-12 NOTE — Progress Notes (Addendum)
Acute Office Visit  Subjective:    Patient ID: Catherine Oneal, female    DOB: Oct 08, 1960, 60 y.o.   MRN: [id number]  Chief Complaint  Patient presents with   Leg Swelling    HPI Patient with history of paraplegia is in today for leg swelling of RLE.  Of note patient was seen in the emergency department on 08/27/2020 for same complaint.  Injury on 08/23/20. Evaluation was negative in the emergency department including ultrasound Dopplers for DVT and negative x-rays.  She was diagnosed with a contusion /hematoma and swelling due to trauma from fall out of wheelchair or when getting herself back into the wheelchair.  Patient then followed up with Dr. Jonni Sanger on 08/30/2020 and was instructed to give this time and elevation.  It appeared that the contusion/hematoma was resolving.  Patient called the office on 09/10/2020 and was requesting a medication to help the swelling in her leg.  Dr. Tamela Oddi note did not mention anything of the sort and therefore she was instructed to make an appointment with [place] while Dr. Jonni Sanger was out of office this week.  She has been wearing compression stockings and elevating at night. Still having swelling during the daytime. She thinks the swelling is about the same as when she saw Dr. Jonni Sanger, but notes there is now pitting edema. Denies any redness. She does not have sensation in her legs 2/2 paraplegia.   Past Medical History:  Diagnosis Date   Anxiety    Helicobacter pylori (H. pylori) infection    Paraplegia ([place])    Rectal incontinence    Spinal injury     Past Surgical History:  Procedure Laterality Date   BACK SURGERY     BREATH TEK H PYLORI N/A 12/09/2012   Procedure: BREATH TEK H PYLORI;  Surgeon: Lafayette Dragon, MD;  Location: [facility];  Service: Endoscopy;  Laterality: N/A;   COLONOSCOPY WITH PROPOFOL N/A 01/03/2019   Procedure: COLONOSCOPY WITH PROPOFOL;  Surgeon: Mauri Pole, MD;  Location: [facility];  Service: Endoscopy;  Laterality:  N/A;   ESOPHAGOGASTRODUODENOSCOPY N/A 10/06/2012   Procedure: ESOPHAGOGASTRODUODENOSCOPY (EGD);  Surgeon: Lafayette Dragon, MD;  Location: [facility];  Service: Endoscopy;  Laterality: N/A;  patient is paraplegic   UPPER GASTROINTESTINAL ENDOSCOPY      Family History  Problem Relation Age of Onset   Breast cancer Paternal Aunt    Diabetes Maternal Grandmother    Diabetes Father    Colon polyps Mother    Diabetes Mother    Colon cancer Neg Hx    Esophageal cancer Neg Hx    Stomach cancer Neg Hx    Rectal cancer Neg Hx     Social History   Socioeconomic History   Marital status: Single    Spouse name: Not on file   Number of children: 0   Years of education: Not on file   Highest education level: Not on file  Occupational History   Occupation: Disabled   Occupation: Scientist, research (medical), Freight forwarder  Tobacco Use   Smoking status: Never   Smokeless tobacco: Never  Vaping Use   Vaping Use: Never used  Substance and Sexual Activity   Alcohol use: Yes    Comment: occasional   Drug use: No   Sexual activity: Never  Other Topics Concern   Not on file  Social History Narrative   Not on file   Social Determinants of Health   Financial Resource Strain: Not on file  Food Insecurity: Not on  file  Transportation Needs: Not on file  Physical Activity: Not on file  Stress: Not on file  Social Connections: Not on file  Intimate Partner Violence: Not on file    Outpatient Medications Prior to Visit  Medication Sig Dispense Refill   busPIRone (BUSPAR) 7.5 MG tablet Take 1 tablet (7.5 mg total) by mouth 2 (two) times daily. (Patient taking differently: Take 7.5 mg by mouth daily.) 60 tablet 2   Multiple Minerals-Vitamins (CITRACAL PLUS) TABS tid  0   Multiple Vitamin (MULTI-VITAMINS) TABS Take by mouth.     sertraline (ZOLOFT) 100 MG tablet Take 100 mg by mouth.     ziprasidone (GEODON) 20 MG capsule Take 20 mg by mouth 2 (two) times daily with a meal.     oxybutynin (DITROPAN) 5 MG  tablet Take 1 tablet (5 mg total) by mouth 3 (three) times daily. 270 tablet 2   No facility-administered medications prior to visit.    No Known Allergies  Review of Systems  Respiratory:  Negative for shortness of breath.   Cardiovascular:  Positive for leg swelling. Negative for chest pain and palpitations.  All other systems reviewed and are negative.     Objective:    Physical Exam Vitals and nursing note reviewed.  Constitutional:      General: She is not in acute distress.    Appearance: Normal appearance. She is normal weight.     Comments: wheelchair  Cardiovascular:     Rate and Rhythm: Normal rate and regular rhythm.     Pulses: Normal pulses.     Comments: RLE: warm to touch, not red; yellow bruising present laterally; diffusely swollen from knee to ankle; pulses 2+ Pulmonary:     Effort: Pulmonary effort is normal.     Breath sounds: Normal breath sounds.  Musculoskeletal:     Right lower leg: 3+ Edema present.     Left lower leg: No edema.  Skin:    Comments: Small spots of erythema noted on both feet, some in between toes, c/w tinea pedis   Neurological:     Mental Status: She is alert.    BP 114/76   Pulse 83   Temp 98.2 F (36.8 C)   Ht 5\' 9"  (1.753 m)   SpO2 98%   BMI 19.37 kg/m  Wt Readings from Last 3 Encounters:  08/30/20 131 lb 3.2 oz (59.5 kg)  08/27/20 130 lb (59 kg)  12/29/19 133 lb (60.3 kg)    Health Maintenance Due  Topic Date Due   Zoster Vaccines- Shingrix (1 of 2) Never done   COVID-19 Vaccine (4 - Booster for Pfizer series) 05/16/2020    There are no preventive care reminders to display for this patient.   Lab Results  Component Value Date   TSH 2.71 05/02/2018   Lab Results  Component Value Date   WBC 7.0 12/29/2019   HGB 13.7 12/29/2019   HCT 41.1 12/29/2019   MCV 94.7 12/29/2019   PLT 368 12/29/2019   Lab Results  Component Value Date   NA 138 12/29/2019   K 4.7 12/29/2019   CO2 25 12/29/2019   GLUCOSE 80  12/29/2019   BUN 17 12/29/2019   CREATININE 0.77 12/29/2019   BILITOT 0.6 12/29/2019   ALKPHOS 65 05/02/2018   AST 20 12/29/2019   ALT 18 12/29/2019   PROT 7.4 12/29/2019   ALBUMIN 4.5 05/02/2018   CALCIUM 10.3 12/29/2019   GFR 86.13 05/02/2018   Lab Results  Component Value  Date   CHOL 152 04/13/2017   Lab Results  Component Value Date   HDL 57.40 04/13/2017   Lab Results  Component Value Date   LDLCALC 74 04/13/2017   Lab Results  Component Value Date   TRIG 102.0 04/13/2017   Lab Results  Component Value Date   CHOLHDL 3 04/13/2017   No results found for: HGBA1C     Assessment & Plan:   Problem List Items Addressed This Visit       Nervous and Auditory   Paraplegia following spinal cord injury ([place])   Relevant Orders   VAS [place] LOWER EXTREMITY VENOUS (DVT)   Other Visit Diagnoses     Pain and swelling of right lower leg    -  Primary   Relevant Orders   VAS [place] LOWER EXTREMITY VENOUS (DVT)   Contusion of right lower extremity, subsequent encounter       Relevant Orders   VAS [place] LOWER EXTREMITY VENOUS (DVT)      1. Pain and swelling of right lower leg 2. Contusion of right lower extremity, subsequent encounter 3. Paraplegia following spinal cord injury [facility]) Dr. Yong Channel participated in evaluation of patient today. 3 weeks since initial injury. We reviewed ED note and noted that the RLE does appear more edematous overall than initial photo. Discussed options and agree that rescanning the leg to r/o DVT since injury is best. Referrals will get this scheduled in the morning. Should she develop any redness, fever, CP, or SOB, she will go straight to ED.  Total encounter time: 38 minutes including record review, H & P, plan, and documentation today.    Cowen Pesqueira M Zarayah Lanting, PA-C

## 2020-09-12 NOTE — Telephone Encounter (Signed)
Patient is scheduled with Alyssa for today.

## 2020-09-13 ENCOUNTER — Ambulatory Visit (HOSPITAL_BASED_OUTPATIENT_CLINIC_OR_DEPARTMENT_OTHER)
Admission: RE | Admit: 2020-09-13 | Discharge: 2020-09-13 | Disposition: A | Discharge status: Home / Self Care | Payer: Medicare Other | Source: Ambulatory Visit | Attending: Physician Assistant | Admitting: Physician Assistant

## 2020-09-13 ENCOUNTER — Encounter: Payer: Self-pay | Admitting: Physician Assistant

## 2020-09-13 ENCOUNTER — Telehealth: Payer: Self-pay

## 2020-09-13 DIAGNOSIS — M7989 Other specified soft tissue disorders: Secondary | ICD-10-CM | POA: Insufficient documentation

## 2020-09-13 DIAGNOSIS — M79661 Pain in right lower leg: Secondary | ICD-10-CM | POA: Diagnosis not present

## 2020-09-13 DIAGNOSIS — R600 Localized edema: Secondary | ICD-10-CM | POA: Diagnosis not present

## 2020-09-13 MED ORDER — CLOTRIMAZOLE-BETAMETHASONE 1-0.05 % EX CREA: 1.0000 "application " | TOPICAL_CREAM | Freq: Two times a day (BID) | CUTANEOUS | 0 refills | Status: AC

## 2020-09-13 NOTE — Telephone Encounter (Signed)
Spoke to pt asked her if she saw message from [place]? Pt said no just about the cream. Told pt Alyssa said [place] was negative for DVT. Great news! Just give this more time and keep elevating and using the compression stocking. Pt verbalized understanding and asked how much time. Told pt if not improved by Monday to let us know. Pt verbalized understanding.

## 2020-09-13 NOTE — Addendum Note (Signed)
Addended by: Theresa Duty on: 09/13/2020 02:46 PM   Modules accepted: Orders

## 2020-09-13 NOTE — Telephone Encounter (Signed)
Patient called to follow up on X-Ray

## 2020-09-16 ENCOUNTER — Telehealth: Payer: Self-pay

## 2020-09-16 NOTE — Telephone Encounter (Signed)
Patient called and stated that she saw Alyssa for leg swelling on 7/722. She stated that Alyssa told her to call back if the swelling is not better. She also asked if there was any medication that can be prescribed to her

## 2020-09-16 NOTE — Telephone Encounter (Signed)
Please see message and advise 

## 2020-09-17 MED ORDER — POTASSIUM CHLORIDE CRYS ER 20 MEQ PO TBCR: 20.0000 meq | EXTENDED_RELEASE_TABLET | Freq: Every day | ORAL | 0 refills | Status: DC | PRN

## 2020-09-17 MED ORDER — FUROSEMIDE 20 MG PO TABS: 20.0000 mg | ORAL_TABLET | Freq: Every day | ORAL | 0 refills | Status: DC | PRN

## 2020-09-17 NOTE — Telephone Encounter (Signed)
See mychart note response.

## 2020-09-17 NOTE — Telephone Encounter (Signed)
Patient calling back she is really worried since her leg is hot to the touch. Patient would like a call back asap.

## 2020-09-17 NOTE — Telephone Encounter (Signed)
Patient called in upset feeling though we were dismissing her issues. Explained to patient that per MyChart messages sent to her from Dr. Jonni Sanger that she did not feel that an antibiotic was needed at this point, but did want to try a diuretic. Patient states that Dr. Jonni Sanger did not read her messages correctly and that her leg is warm to the touch.  I informed patient that Dr. Jonni Sanger had left for the day but that all MyChart messages were sent to her to review in the morning. Patient demanded to speak to Dr. Nicole Kindred. Informed patient that we do not have a provider by the name of Dr. Nicole Kindred in out office, but that had seen Alyssa previously and she had also left for the day.  Patient continued asking questions that I was unable to answer and I reassured her that Dr. Jonni Sanger would review messages in the morning. Patient became very upset stating that we are ruining her free time and she has already cancelled her upcoming vacation because if this unresolved leg issue and proceeded to hang up the phone.

## 2020-09-18 ENCOUNTER — Other Ambulatory Visit: Payer: Self-pay

## 2020-09-18 MED ORDER — AMOXICILLIN-POT CLAVULANATE 875-125 MG PO TABS: 1.0000 | ORAL_TABLET | Freq: Two times a day (BID) | ORAL | 0 refills | Status: DC

## 2020-09-18 NOTE — Telephone Encounter (Signed)
Augmentin sent to pharmacy. Patient states that she will return call to schedule an appt

## 2020-09-20 ENCOUNTER — Encounter: Payer: Self-pay | Admitting: Family Medicine

## 2020-09-20 ENCOUNTER — Ambulatory Visit (INDEPENDENT_AMBULATORY_CARE_PROVIDER_SITE_OTHER): Payer: Medicare Other | Admitting: Family Medicine

## 2020-09-20 ENCOUNTER — Other Ambulatory Visit: Payer: Self-pay

## 2020-09-20 VITALS — BP 100/68 | HR 82 | Temp 98.0°F

## 2020-09-20 DIAGNOSIS — M1711 Unilateral primary osteoarthritis, right knee: Secondary | ICD-10-CM | POA: Diagnosis not present

## 2020-09-20 DIAGNOSIS — R600 Localized edema: Secondary | ICD-10-CM | POA: Diagnosis not present

## 2020-09-20 LAB — COMPREHENSIVE METABOLIC PANEL
ALT: 8 U/L (ref 0–35)
AST: 16 U/L (ref 0–37)
Albumin: 4.4 g/dL (ref 3.5–5.2)
Alkaline Phosphatase: 151 U/L — ABNORMAL HIGH (ref 39–117)
BUN: 17 mg/dL (ref 6–23)
CO2: 30 mEq/L (ref 19–32)
Calcium: 10.4 mg/dL (ref 8.4–10.5)
Chloride: 101 mEq/L (ref 96–112)
Creatinine, Ser: 0.72 mg/dL (ref 0.40–1.20)
GFR: 91.29 mL/min (ref >60.00)
Glucose, Bld: 87 mg/dL (ref 70–99)
Potassium: 4.3 mEq/L (ref 3.5–5.1)
Sodium: 138 mEq/L (ref 135–145)
Total Bilirubin: 0.4 mg/dL (ref 0.2–1.2)
Total Protein: 7.7 g/dL (ref 6.0–8.3)

## 2020-09-20 LAB — CBC WITH DIFFERENTIAL/PLATELET
Basophils Absolute: 0.1 10*3/uL (ref 0.0–0.1)
Basophils Relative: 1 % (ref 0.0–3.0)
Eosinophils Absolute: 0.1 10*3/uL (ref 0.0–0.7)
Eosinophils Relative: 1.2 % (ref 0.0–5.0)
HCT: 38 % (ref 36.0–46.0)
Hemoglobin: 12.9 g/dL (ref 12.0–15.0)
Lymphocytes Relative: 43.8 % (ref 12.0–46.0)
Lymphs Abs: 2.7 10*3/uL (ref 0.7–4.0)
MCHC: 33.9 g/dL (ref 30.0–36.0)
MCV: 93.9 fl (ref 78.0–100.0)
Monocytes Absolute: 0.4 10*3/uL (ref 0.1–1.0)
Monocytes Relative: 6 % (ref 3.0–12.0)
Neutro Abs: 3 10*3/uL (ref 1.4–7.7)
Neutrophils Relative %: 48 % (ref 43.0–77.0)
Platelets: 415 10*3/uL — ABNORMAL HIGH (ref 150.0–400.0)
RBC: 4.04 Mil/uL (ref 3.87–5.11)
RDW: 12.7 % (ref 11.5–15.5)
WBC: 6.2 10*3/uL (ref 4.0–10.5)

## 2020-09-20 LAB — TSH: TSH: 3.21 u[IU]/mL (ref 0.35–5.50)

## 2020-09-20 NOTE — Patient Instructions (Signed)
Please return in 3-4 weeks to recheck the leg  Complete the antibiotics.  Take the lasix for 3 days in a row, then as needed for swelling Ice the knee twice a day for ten minutes for the next several days. Wear a compression stocking as tolerated.  Consider consulting with your PM&R physician as well.   I will release your lab results to you on your MyChart account with further instructions. Please reply with any questions.    If you have any questions or concerns, please don't hesitate to send me a message via MyChart or call the office at [phone]. Thank you for visiting with [place] today! It's our pleasure caring for you.

## 2020-09-20 NOTE — Progress Notes (Signed)
Subjective  CC:  Chief Complaint  Patient presents with   Leg Swelling    Pt c/o swelling in right lower leg.    HPI: Catherine Oneal is a 60 y.o. female who presents to the office today to address the problems listed above in the chief complaint. 60 year old paraplegic returns due to persistent right lower extremity swelling.  I reviewed all notes since her emergency room visit which included follow-up with me in follow-up with our midlevel provider.  I also reviewed her x-ray.  To briefly summarize, patient reports that she noted some swelling in her right knee the morning of her ER visit.  That day she also fell out of the wheelchair and landed on her left side.  She got herself back pain.  Some redness over the knee but thought that it was related to the trauma of getting back into the wheelchair.  ER evaluation revealed a swollen lower extremity, negative x-ray of tib-fib for fractures, there are mild OA changes of the knee present by my review, negative Doppler ultrasound of the leg.  X-ray did show soft tissue swelling.  She was diagnosed with contusion.  In follow-up, things seem to be improving.  However swelling has worsened over the last week.  She also feels warmth at the knee.  There is been no redness or rash.  We started Augmentin several days ago to cover for possible infection given this was her worry.  She took 1 dose of Lasix but worries about possible side effects.  She had no prior history of lower extremity edema, no prior history of venous insufficiency.    Assessment  1. Lower extremity edema   2. Arthropathy of right knee      Plan  Right lower extremity swelling: Had long discussion regarding possible etiologies.  Likely multifactorial.  I suspect the warmth and swelling around the knee is coming from the joint.  Possibly osteoarthritis.  Patient has no sensation in that leg and is not having pain.  There is no signs of active cellulitis.  She wants to continue  Augmentin which I think is fine.  Discussed management strategies of elevation, compression hose and Lasix.  This could be the beginning of chronic venous insufficiency.  We will rule out kidney or liver problems with lab work today.  Arterial blood flow is normal with normal palpable pulses.  Swelling from the contusion should be resolved.  She could have some autonomic nervous dysfunction from her chronic spinal cord injury which could be contributing to the inability to clear the swelling quickly.  Reassured by her use of Lasix.  This is a safe medication for her.  Follow-up in 3 to 4 weeks.  Consider consultation with PMR specialist  Follow up: 3 to 4 weeks to recheck. 10/30/2020  Orders Placed This Encounter  Procedures   CBC with Differential/Platelet   Comprehensive metabolic panel   TSH   No orders of the defined types were placed in this encounter.     I reviewed the patients updated PMH, FH, and SocHx.    Patient Active Problem List   Diagnosis Date Noted   Positive colorectal cancer screening using Cologuard test    Postmenopausal bleeding 03/18/2018   Neurogenic bowel 11/08/2013   Anxiety 05/21/2013   Urinary incontinence 05/09/2013   Neurogenic bladder 11/14/2012   Vitamin D deficiency 10/29/2011   Osteoporosis 10/22/2011   Episodic mood disorder ([place]) 07/02/2007   Paraplegia following spinal cord injury ([place]) 07/02/2007  RECTAL INCONTINENCE 07/02/2007   History of spinal cord injury 07/02/2007   Current Meds  Medication Sig   amoxicillin-clavulanate (AUGMENTIN) 875-125 MG tablet Take 1 tablet by mouth 2 (two) times daily.   busPIRone (BUSPAR) 7.5 MG tablet Take 1 tablet (7.5 mg total) by mouth 2 (two) times daily. (Patient taking differently: Take 7.5 mg by mouth daily.)   clotrimazole-betamethasone (LOTRISONE) cream Apply 1 application topically 2 (two) times daily.   furosemide (LASIX) 20 MG tablet Take 1 tablet (20 mg total) by mouth daily as needed for edema.  Take with potassium   Multiple Minerals-Vitamins (CITRACAL PLUS) TABS tid   Multiple Vitamin (MULTI-VITAMINS) TABS Take by mouth.   potassium chloride SA (KLOR-CON) 20 MEQ tablet Take 1 tablet (20 mEq total) by mouth daily as needed (leg swelling). Take with furosemide   sertraline (ZOLOFT) 100 MG tablet Take 100 mg by mouth.   ziprasidone (GEODON) 20 MG capsule Take 20 mg by mouth 2 (two) times daily with a meal.    Allergies: Patient has No Known Allergies. Family History: Patient family history includes Breast cancer in her paternal aunt; Colon polyps in her mother; Diabetes in her father, maternal grandmother, and mother. Social History:  Patient  reports that she has never smoked. She has never used smokeless tobacco. She reports current alcohol use. She reports that she does not use drugs.  Review of Systems: Constitutional: Negative for fever malaise or anorexia Cardiovascular: negative for chest pain Respiratory: negative for SOB or persistent cough Gastrointestinal: negative for abdominal pain  Objective  Vitals: BP 100/68 (BP Location: Left Arm, Patient Position: Sitting, Cuff Size: Normal)   Pulse 82   Temp 98 F (36.7 C) (Temporal)   SpO2 98%  General: no acute distress , A&Ox3 Right lower extremity: Bony changes to right knee with warmth present.  No erythema to the right lower extremity.  No rash.  +2 distal pulses.  +2 pitting edema to knee.  No cords palpable.    Commons side effects, risks, benefits, and alternatives for medications and treatment plan prescribed today were discussed, and the patient expressed understanding of the given instructions. Patient is instructed to call or message via MyChart if he/she has any questions or concerns regarding our treatment plan. No barriers to understanding were identified. We discussed Red Flag symptoms and signs in detail. Patient expressed understanding regarding what to do in case of urgent or emergency type symptoms.   Medication list was reconciled, printed and provided to the patient in AVS. Patient instructions and summary information was reviewed with the patient as documented in the AVS. This note was prepared with assistance of Dragon voice recognition software. Occasional wrong-word or sound-a-like substitutions may have occurred due to the inherent limitations of voice recognition software  This visit occurred during the SARS-CoV-2 public health emergency.  Safety protocols were in place, including screening questions prior to the visit, additional usage of staff PPE, and extensive cleaning of exam room while observing appropriate contact time as indicated for disinfecting solutions.

## 2020-09-22 ENCOUNTER — Encounter: Payer: Self-pay | Admitting: Family Medicine

## 2020-09-27 ENCOUNTER — Telehealth: Payer: Self-pay | Admitting: Family Medicine

## 2020-09-27 NOTE — Progress Notes (Signed)
  Chronic Care Management   Outreach Note  09/27/2020 Name: Catherine Oneal MRN: [id number] DOB: 01-Apr-1960  Referred by: Leamon Arnt, MD Reason for referral : No chief complaint on file.   A second unsuccessful telephone outreach was attempted today. The patient was referred to pharmacist for assistance with care management and care coordination.  Follow Up Plan:   Lauretta Grill Upstream Scheduler

## 2020-09-30 DIAGNOSIS — N319 Neuromuscular dysfunction of bladder, unspecified: Secondary | ICD-10-CM | POA: Diagnosis not present

## 2020-09-30 DIAGNOSIS — N3941 Urge incontinence: Secondary | ICD-10-CM | POA: Diagnosis not present

## 2020-09-30 DIAGNOSIS — R339 Retention of urine, unspecified: Secondary | ICD-10-CM | POA: Diagnosis not present

## 2020-10-08 DIAGNOSIS — F411 Generalized anxiety disorder: Secondary | ICD-10-CM | POA: Diagnosis not present

## 2020-10-28 DIAGNOSIS — G8220 Paraplegia, unspecified: Secondary | ICD-10-CM | POA: Diagnosis not present

## 2020-10-30 ENCOUNTER — Ambulatory Visit: Payer: Medicare Other | Admitting: Family Medicine

## 2020-12-02 DIAGNOSIS — N3941 Urge incontinence: Secondary | ICD-10-CM | POA: Diagnosis not present

## 2020-12-30 ENCOUNTER — Encounter: Payer: Medicare Other | Admitting: Family Medicine

## 2021-01-21 DIAGNOSIS — F411 Generalized anxiety disorder: Secondary | ICD-10-CM | POA: Diagnosis not present

## 2021-02-06 DIAGNOSIS — Z20822 Contact with and (suspected) exposure to covid-19: Secondary | ICD-10-CM | POA: Diagnosis not present

## 2021-02-06 DIAGNOSIS — Z03818 Encounter for observation for suspected exposure to other biological agents ruled out: Secondary | ICD-10-CM | POA: Diagnosis not present

## 2021-04-08 ENCOUNTER — Telehealth: Payer: Self-pay | Admitting: Family Medicine

## 2021-04-08 NOTE — Telephone Encounter (Signed)
Copied from [place] [id number]. Topic: Medicare AWV >> Apr 08, 2021 11:05 AM Harris-Coley, Hannah Beat wrote: Reason for CRM: Left message for patient to schedule Annual Wellness Visit.  Please schedule with Nurse Health Advisor Charlott Rakes, RN at [facility].  Please call [phone] ask for [facility]

## 2021-12-01 ENCOUNTER — Encounter: Payer: Self-pay | Admitting: *Deleted

## 2022-05-28 NOTE — Progress Notes (Unsigned)
Medical Nutrition Therapy Appt start time: [id number] end time: 1130 (15 minutes) Primary concerns today:  osteoporosis .  PCP Billey Chang, MD  Relevant history/background: Catherine Oneal was seen previously as part of a [facility] nutrition class.  She is concerned about bone density b/c she is unable to bear weight (confined to a wheelchair since spinal cord injury at age 62), so wants to optimize diet and any other measures she can take for bone health. 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  Recent Xrays Catherine Oneal had suggested osteopenia at a couple of sites. Per physician recommendation, Catherine Oneal has been taking 2 Citracal-Petites (or regular), providing 400 to 650 mg Ca caplets 2 X per day and has been taking a MVM, which also provides 400 mg Ca for a total supplemental Ca intake of 1200 to  1750 mg/day.    Assessment:  Catherine Oneal has recently had delayed gastric emptying, so has been eating fewer meals on some days, and has had alternating problems with constipation and diarrhea.  To help manage constipation, she has  been using slippery elm bark, burdock root, anise seed, fennel seed, deglycyrrhizinated licorice, and sesame seed, or miralax on some days.   Catherine Oneal has been writing a memoir, which has required a lot of time and has taken an emotional toll, which has made exercise motivation hard.    Learning Readiness: Ready   Usual eating pattern: 1-3 meals and 0-1 snack per day. Frequent foods and beverages: water; chx, seafood, vegetables, avocado, banana, fruit.   Avoided foods: None.   Usual physical activity: No consistent routine, but does wheelchair ex classes sporadically, and walks her dog ~1 mile 4-5 X wk.  Sporadic strength trng ~30 min 1  X wk.   Sleep: Worse (~erratic) lately; 2-11 hrs/night.  Not sure why.  Sometimes doing "Deep Sleep for [employer] Healing," which does help her relax.   24-hr recall: (Up at 8:30 AM, then back to sleep at 11 AM) B (11:30 AM)-  3 c blk tea with ginger & cinnamon over ~3 hrs) Snk ( AM)-   --- L ( PM)-  --- Snk ( PM)-  --- D (6 PM)-  Steamed mussels, bread, olive oil, 1 1/2 c broccoli, side salad, 2 tbsp drsng, water, 1 vodka martini  Snk ( PM)-  --- Typical day? No.  Felt constipated, so poor appetite.  Also, slept abnormally late after poor sleep during the night.    Nutritional Diagnosis:  NB-1.1 Food and nutrition-related knowledge deficit As related to bone health.  As evidenced by expressed confusion as to best diet for bone health.  Handouts given during visit include: After-Visit Summary (AVS) Handout on nutrition recommendations for bone health  Demonstrated degree of understanding via:  Teach Back  Barriers to learning/adherence to lifestyle change: Challenges include travel (makes poor food choices) and finds some foods hard to resist if available, e.g., chips.   Monitoring/Evaluation:  prn.

## 2022-06-01 ENCOUNTER — Ambulatory Visit (INDEPENDENT_AMBULATORY_CARE_PROVIDER_SITE_OTHER): Payer: Medicare Other | Admitting: Family Medicine

## 2022-06-01 DIAGNOSIS — M810 Age-related osteoporosis without current pathological fracture: Secondary | ICD-10-CM

## 2022-06-01 NOTE — Patient Instructions (Addendum)
Call [facility] with dosage of calcium you are getting:  - from Citracal - from MVM  Suggestions; - Consumption of 5-10 prunes per day has been shown to help maintain bone mineral density.  This may also help with constipation.   - Supplement vitamin D based on your blood levels of vitamin D (hydroxy vitamin D), measured in ng/mL. (Ideal range is 30-40 ng/mL.)  Behavioral Goals: 1. Obtain some type of strength training at least twice a week, ideally getting >60 minutes per week.      - You may want to contract with a personal trainer to help you set up a program for you.        - Schedule your workouts each week.  (Consistent days and times helps with follow-through.) 2. Include a protein food with each meal, aiming for at least 20 grams of protein per meal from that protein meal.  (You will also get small amounts of protein in other foods.) 3. Eat at least 3 REAL meals per day at least 5 days a week.       - A REAL breakfast must include at least a source of protein and starch (fruit optional)     - A REAL lunch or dinner must include at least a source of protein, starch, and vegetables.    A good daily protein goal for you will be at least 60 grams per day.   Each 1 oz of meat, fish, poultry, or 1 egg = 7 grams of protein.  Each 1 full cup of most beans = 15 grams protein.  1 cup of milk = 8 grams of protein.    F/U as needed.

## 2022-06-15 ENCOUNTER — Ambulatory Visit: Payer: Medicare Other | Admitting: Family Medicine

## 2022-08-09 IMAGING — US US EXTREM LOW VENOUS*R*
2 series · 13 of 24 positions shown · non-contrast
Comparison: None.

CLINICAL DATA: Right lower extremity in the redness and swelling.
Injury. Paraplegic. Color changes and ulceration positive.

EXAM:
RIGHT LOWER EXTREMITY VENOUS DOPPLER ULTRASOUND
TECHNIQUE: Gray-scale sonography with compression, as well as color and duplex
ultrasound, were performed to evaluate the deep venous system(s)
from the level of the common femoral vein through the popliteal and
proximal calf veins.

[Series 1: us venous img lower uni right (dvt) · portal-venous · 12 of 30 slices shown]
[im 1/30]
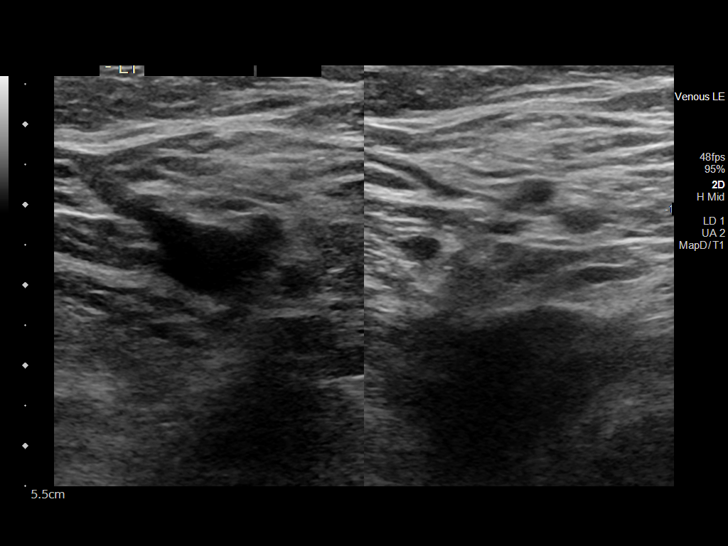
[im 3/30]
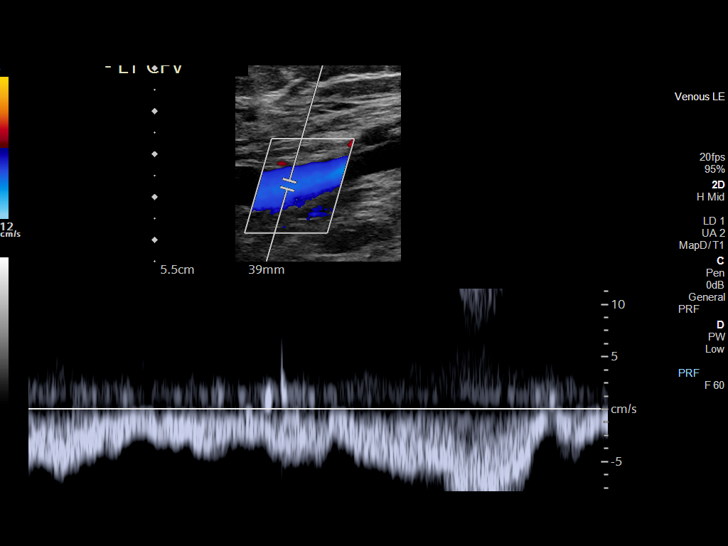
[im 6/30]
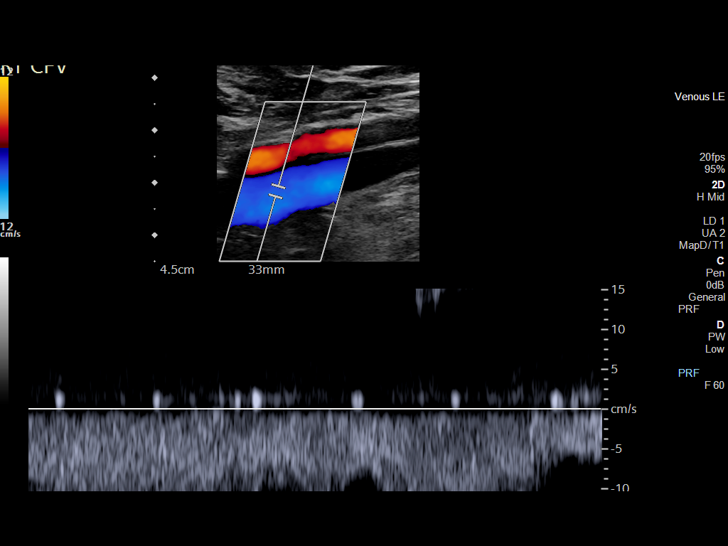
[im 9/30]
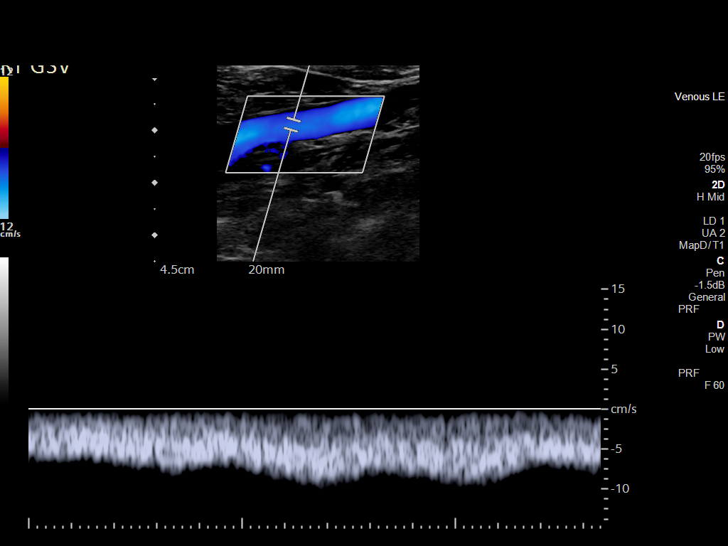
[im 12/30]
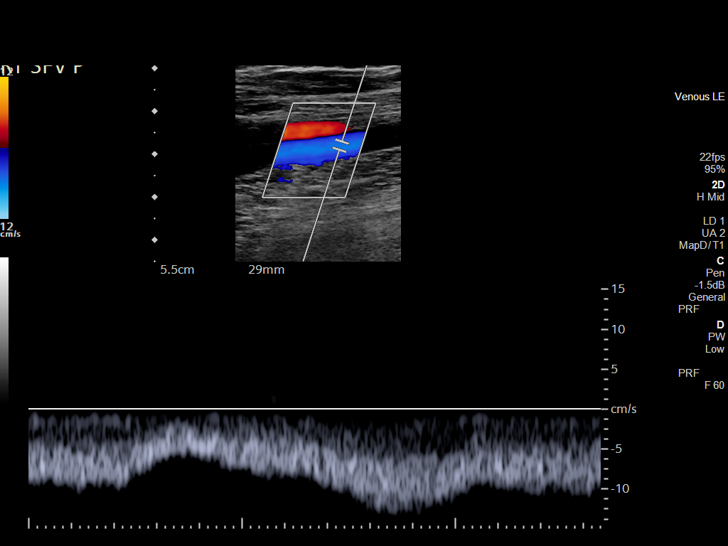
[im 14/30]
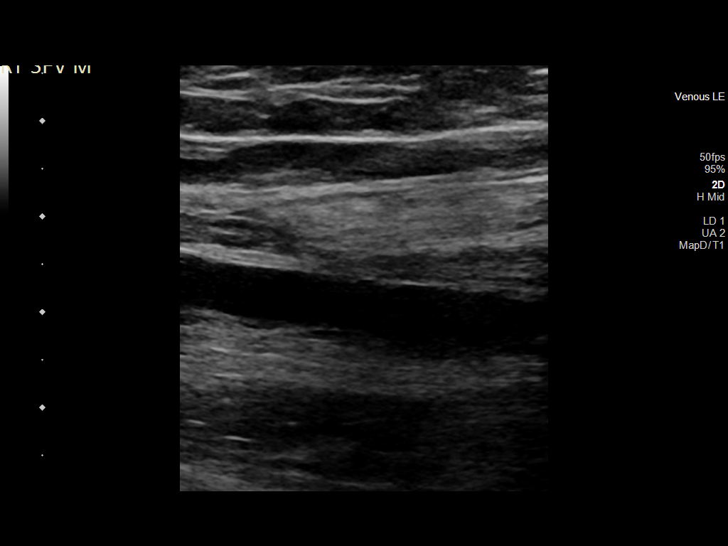
[im 17/30]
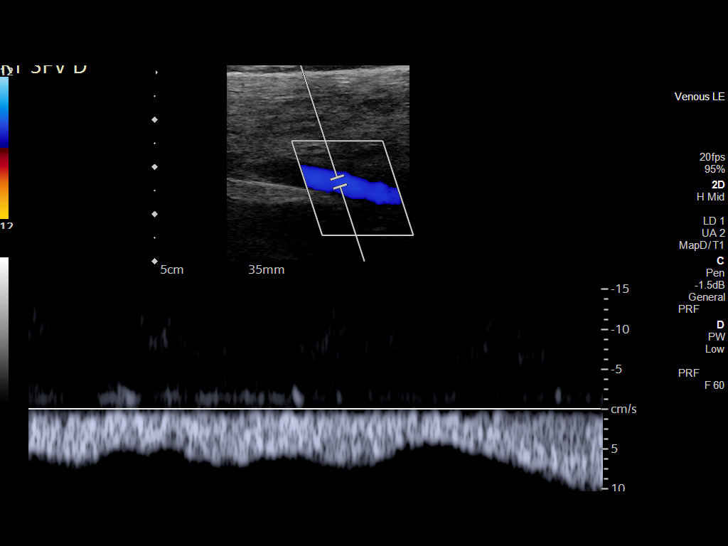
[im 18/30]
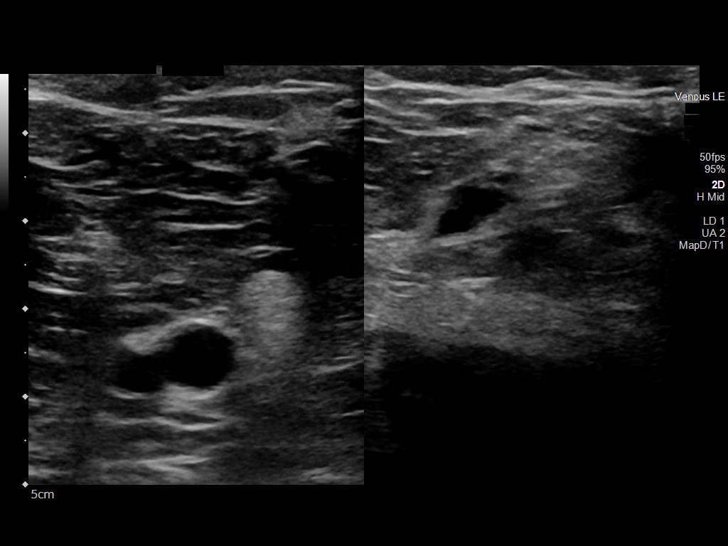
[im 21/30]
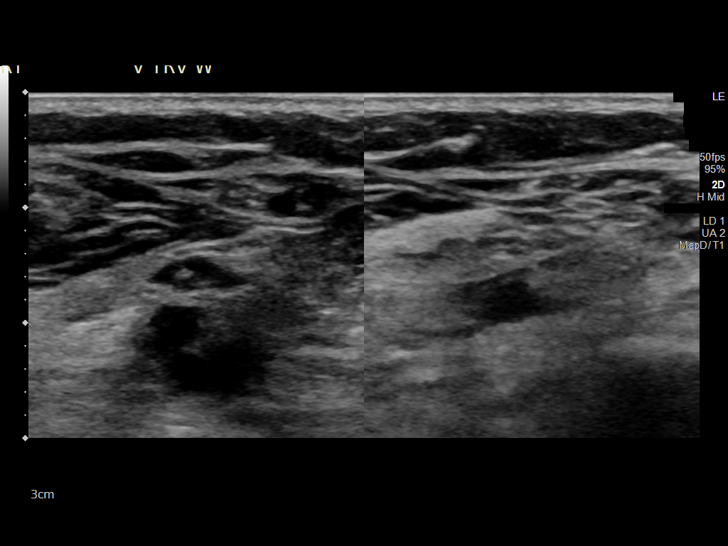
[im 24/30]
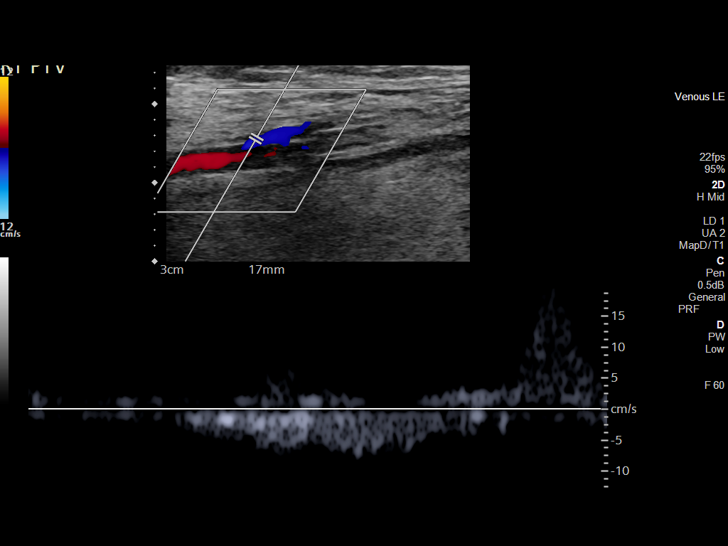
[im 27/30]
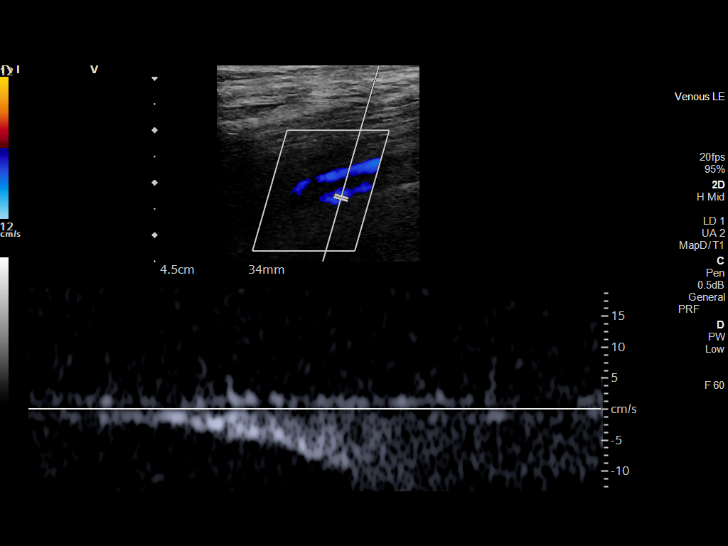
[im 30/30]
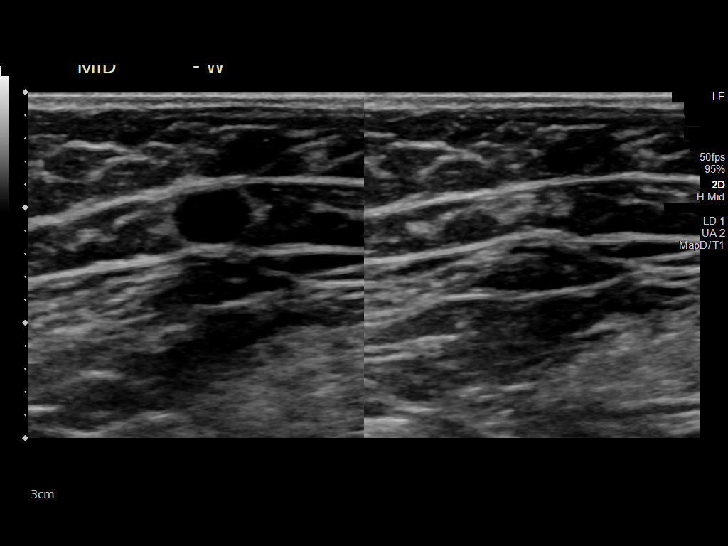

[Series 1001: venous le · portal-venous · 1 of 3 slices shown]
[im 3/3]
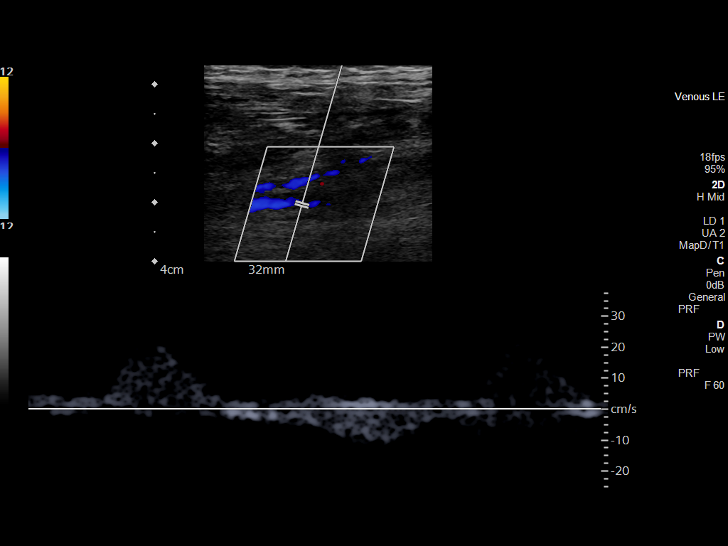

[13 of 24 positions shown; findings below may reference images not displayed]

FINDINGS: VENOUS

Normal compressibility of the common femoral, superficial femoral,
and popliteal veins, as well as the visualized calf veins.
Visualized portions of profunda femoral vein and great saphenous
vein unremarkable. No filling defects to suggest DVT on grayscale or
color Doppler imaging. Doppler waveforms show normal direction of
venous flow, normal respiratory plasticity and response to
augmentation.

Limited views of the contralateral common femoral vein are
unremarkable.

OTHER

None.

Limitations: Ultrasound sonographer notes proximal lateral leg area
of edema and bruising.
IMPRESSION: No deep venous thrombosis of the right lower extremity.

## 2022-08-09 IMAGING — DX DG TIBIA/FIBULA 2V*R*
1 series · 2 of 2 positions shown · non-contrast
Comparison: None.

CLINICAL DATA: Swelling

EXAM:
RIGHT TIBIA AND FIBULA - 2 VIEW

[Series 1: leg · 0.14mm/px · 2 of 2 slices shown]
[im 1/2]
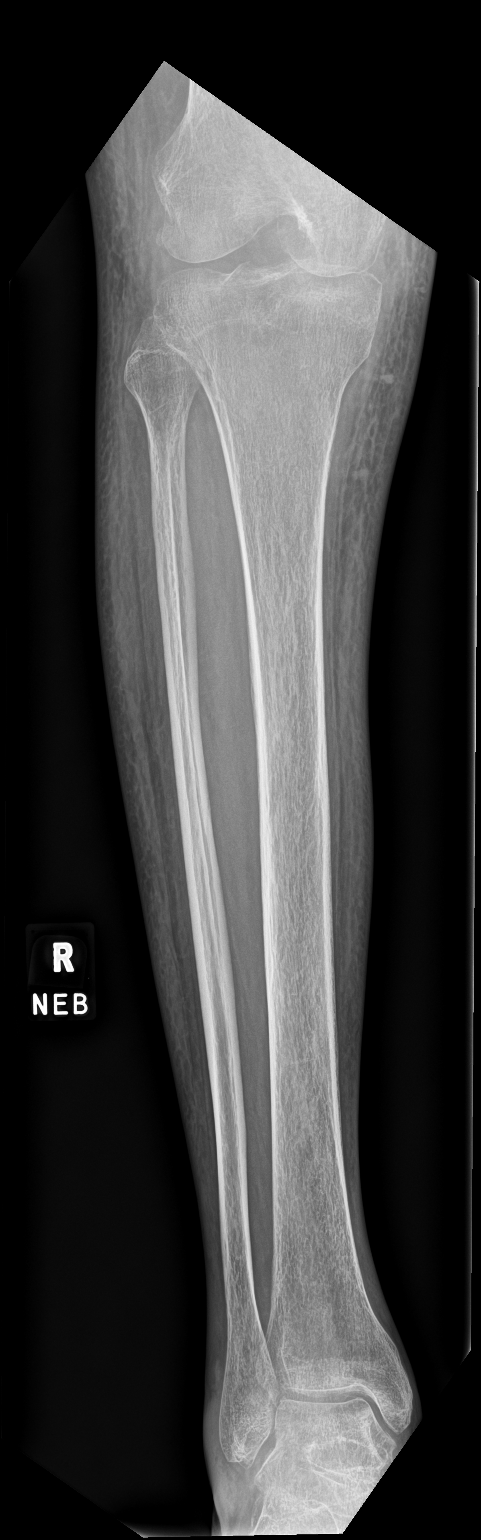
[im 2/2]
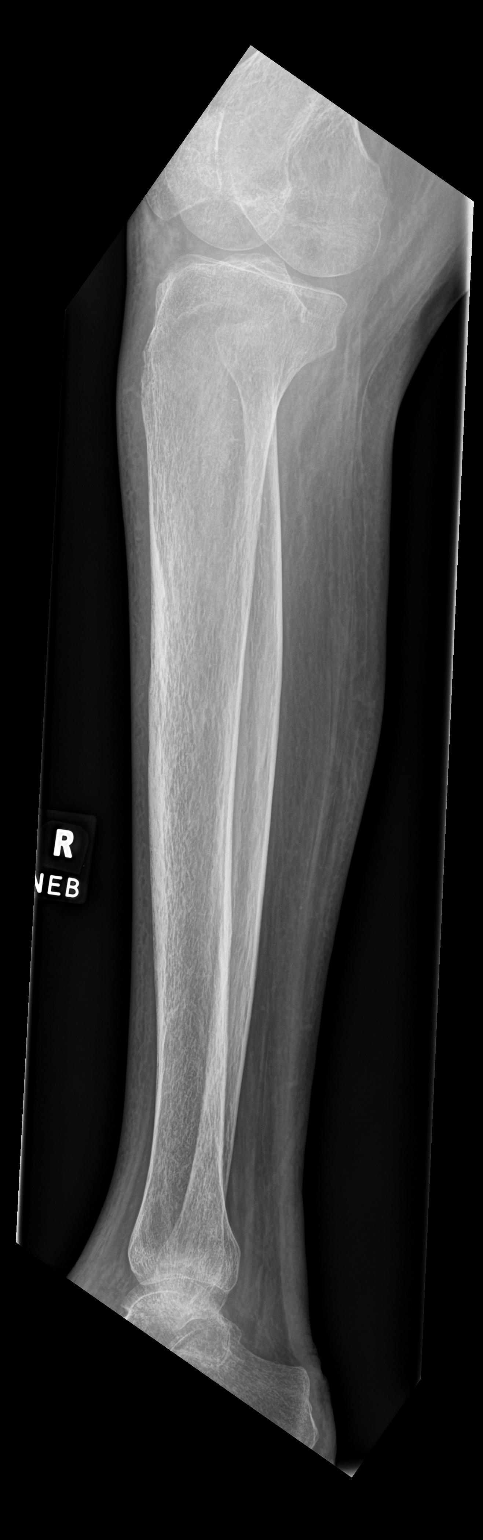

[2 of 2 positions shown; findings below may reference images not displayed]

FINDINGS: No fracture or malalignment. Diffuse soft tissue edema. No
radiopaque foreign body.
IMPRESSION: No acute osseous abnormality

## 2022-08-26 IMAGING — US US EXTREM LOW VENOUS*R*
1 series · 13 of 24 positions shown · non-contrast
Comparison: None.

CLINICAL DATA: Right lower extremity edema.



[Series 1: us venous img lower uni right (dvt) · portal-venous · 13 of 50 slices shown]
[im 1/50]
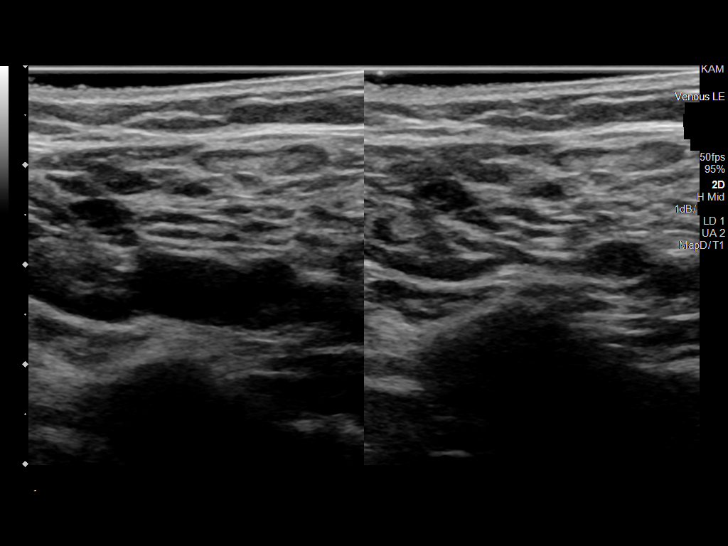
[im 5/50]
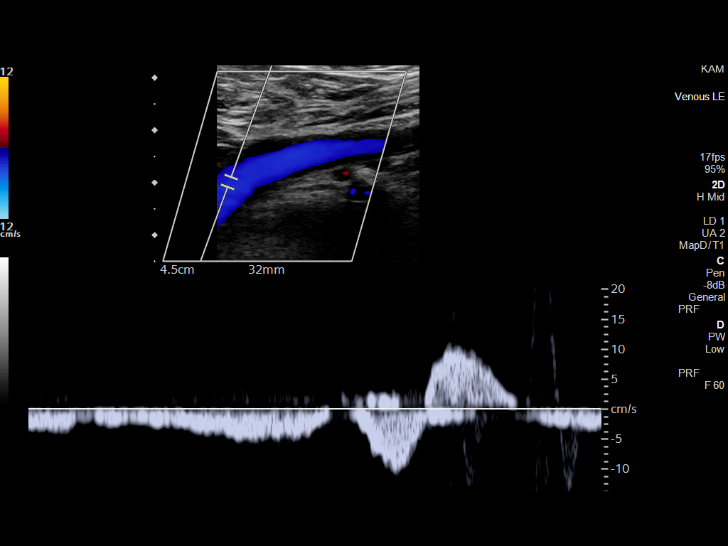
[im 9/50]
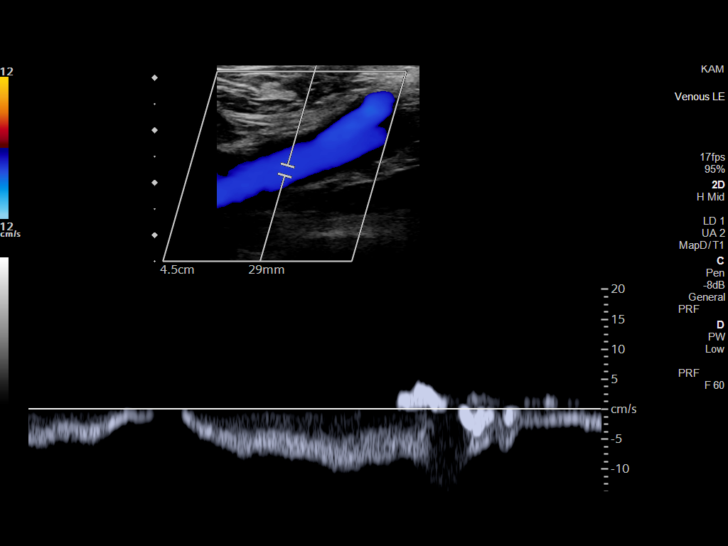
[im 13/50]
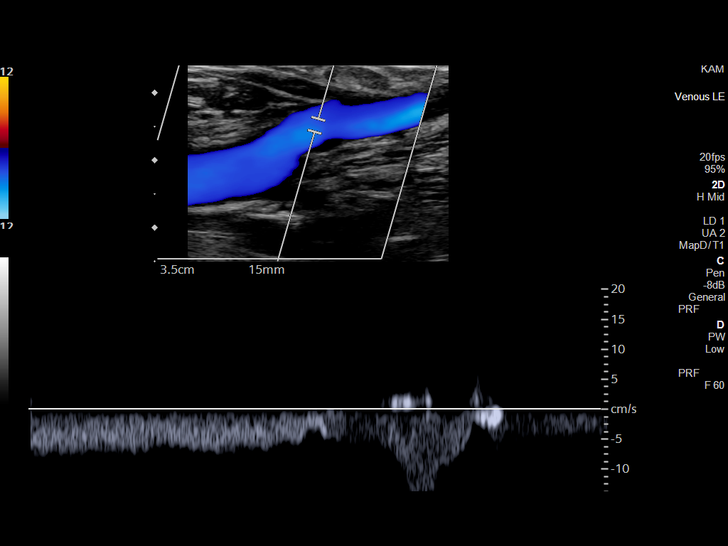
[im 18/50]
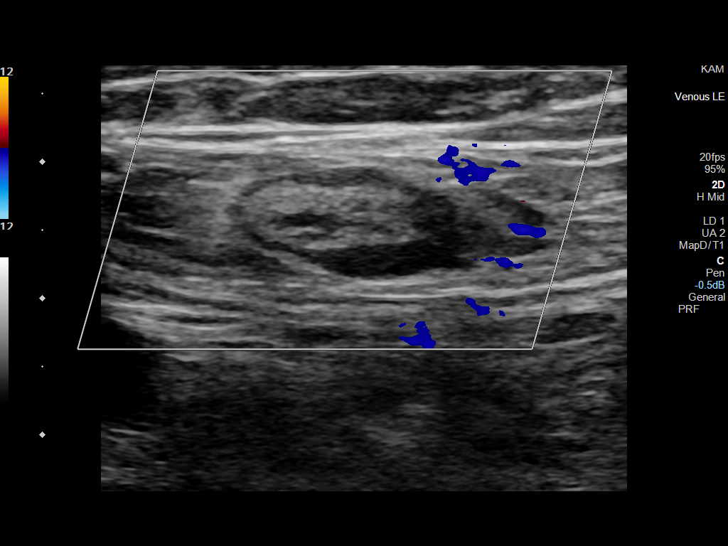
[im 22/50]
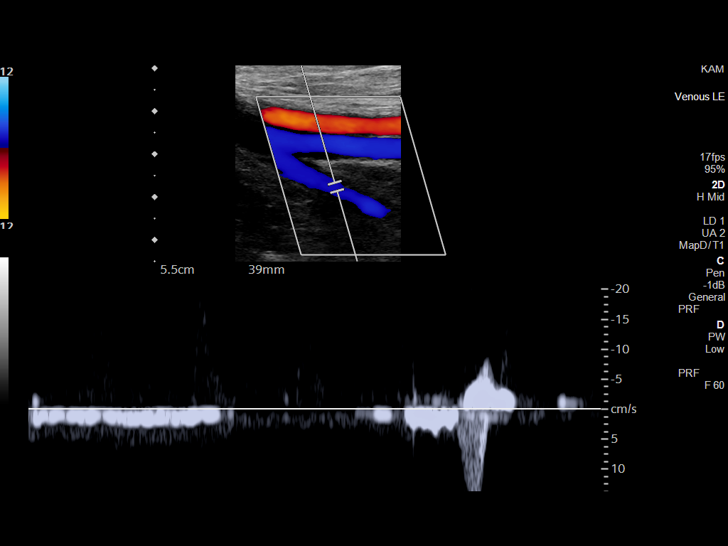
[im 26/50]
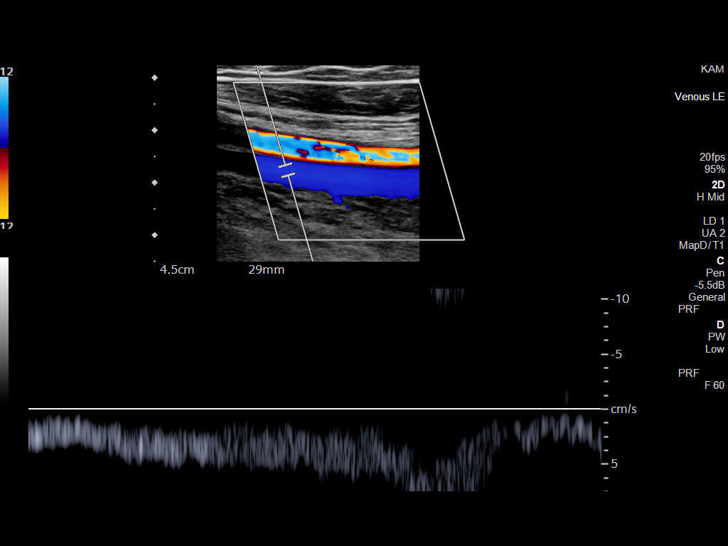
[im 28/50]
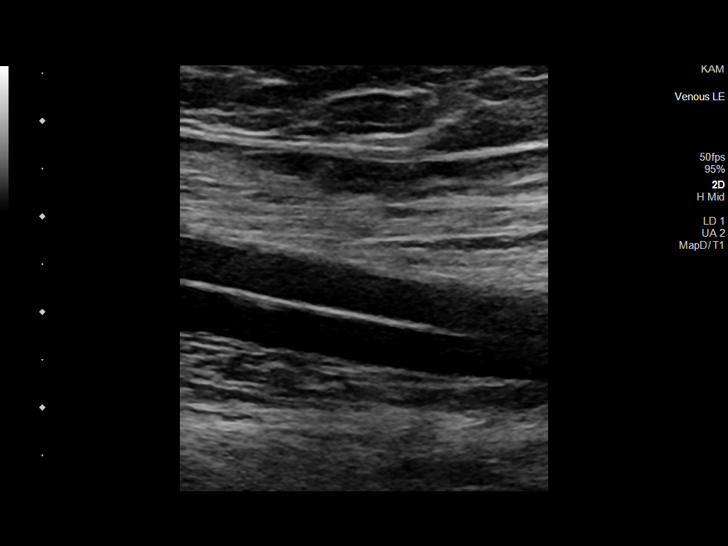
[im 32/50]
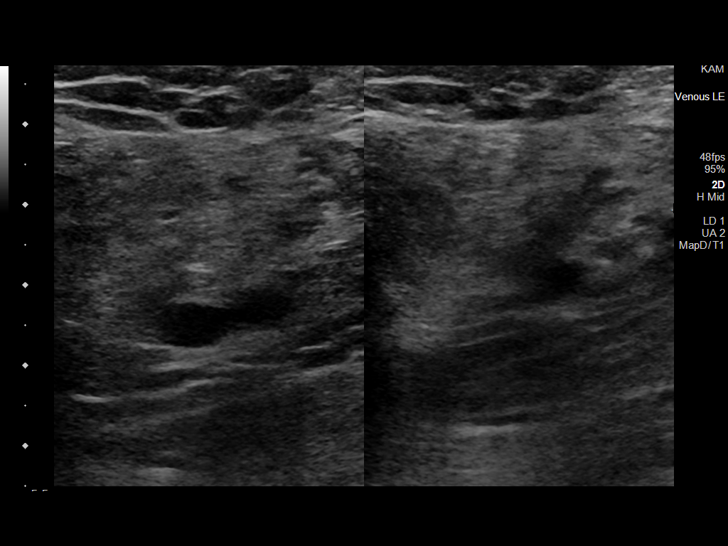
[im 37/50]
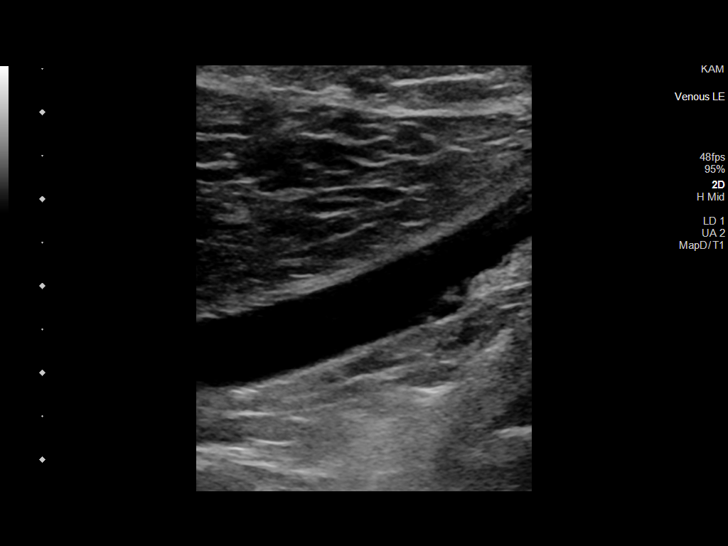
[im 41/50]
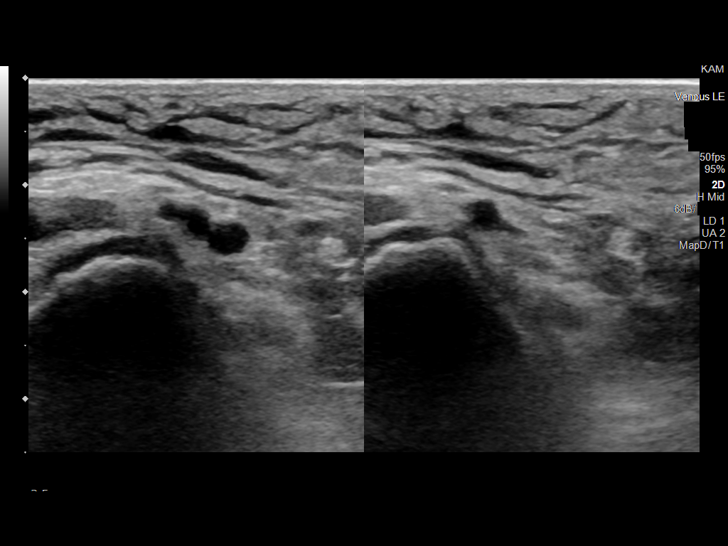
[im 45/50]
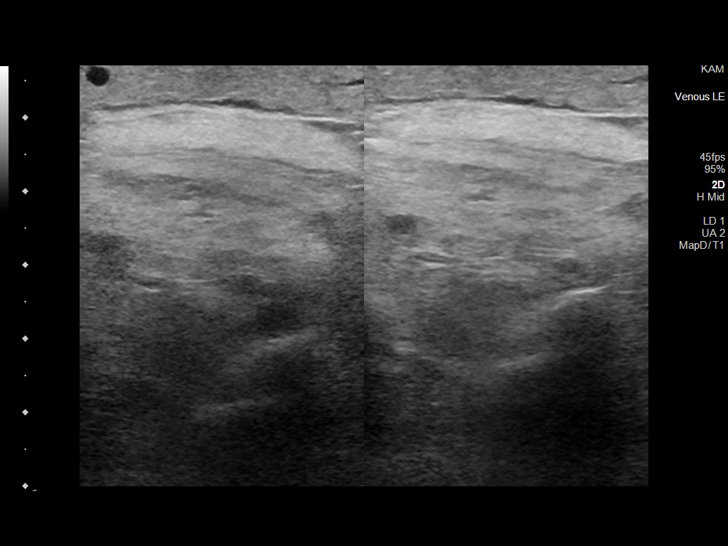
[im 50/50]
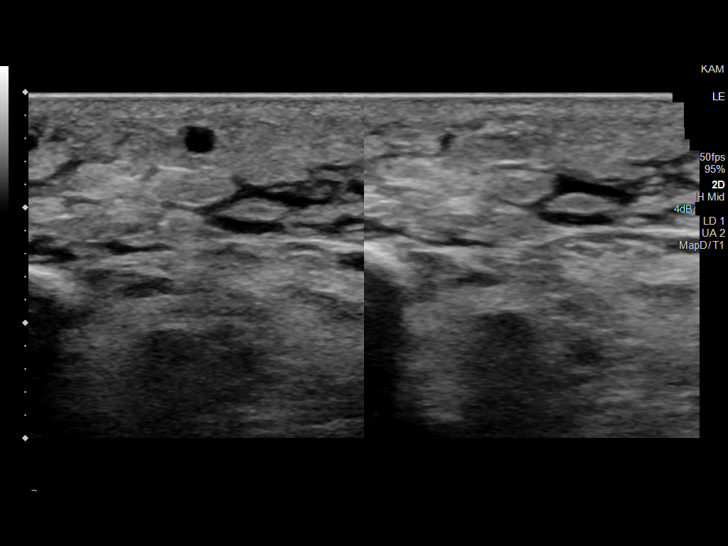

[13 of 24 positions shown; findings below may reference images not displayed]

FINDINGS: Contralateral Common Femoral Vein: Respiratory phasicity is normal
and symmetric with the symptomatic side. No evidence of thrombus.
Normal compressibility.

Common Femoral Vein: No evidence of thrombus. Normal
compressibility, respiratory phasicity and response to augmentation.

Saphenofemoral Junction: No evidence of thrombus. Normal
compressibility and flow on color Doppler imaging.

Profunda Femoral Vein: No evidence of thrombus. Normal
compressibility and flow on color Doppler imaging.

Femoral Vein: No evidence of thrombus. Normal compressibility,
respiratory phasicity and response to augmentation.

Popliteal Vein: No evidence of thrombus. Normal compressibility,
respiratory phasicity and response to augmentation.

Calf Veins: No evidence of thrombus. Normal compressibility and flow
on color Doppler imaging.

Superficial Great Saphenous Vein: No evidence of thrombus. Normal
compressibility.

Venous Reflux:  None.

Other Findings: No evidence of superficial thrombophlebitis or
abnormal fluid collection.
IMPRESSION: No evidence of right lower extremity deep venous thrombosis.
